# Patient Record
Sex: Male | Born: 1955 | Race: Black or African American | Hispanic: No | Marital: Single | Smoking: Never smoker
Health system: Southern US, Community
[De-identification: ages and names within clinical notes are randomized; demographics above are authoritative.]

## PROBLEM LIST (undated history)

## (undated) DIAGNOSIS — E669 Obesity, unspecified: Secondary | ICD-10-CM

## (undated) DIAGNOSIS — E119 Type 2 diabetes mellitus without complications: Secondary | ICD-10-CM

## (undated) DIAGNOSIS — E785 Hyperlipidemia, unspecified: Secondary | ICD-10-CM

## (undated) DIAGNOSIS — H269 Unspecified cataract: Secondary | ICD-10-CM

## (undated) DIAGNOSIS — I10 Essential (primary) hypertension: Secondary | ICD-10-CM

## (undated) HISTORY — DX: Essential (primary) hypertension: I10

## (undated) HISTORY — DX: Type 2 diabetes mellitus without complications: E11.9

## (undated) HISTORY — DX: Hyperlipidemia, unspecified: E78.5

## (undated) HISTORY — DX: Unspecified cataract: H26.9

## (undated) HISTORY — PX: ABDOMINAL SURGERY: SHX537

## (undated) HISTORY — DX: Obesity, unspecified: E66.9

---

## 1998-04-25 ENCOUNTER — Emergency Department (HOSPITAL_COMMUNITY): Admission: EM | Admit: 1998-04-25 | Discharge: 1998-04-25 | Payer: Self-pay | Admitting: Emergency Medicine

## 2002-07-19 ENCOUNTER — Encounter: Admission: RE | Admit: 2002-07-19 | Discharge: 2002-10-17 | Payer: Self-pay | Admitting: Internal Medicine

## 2004-04-16 ENCOUNTER — Emergency Department (HOSPITAL_COMMUNITY): Admission: EM | Admit: 2004-04-16 | Discharge: 2004-04-16 | Payer: Self-pay | Admitting: Emergency Medicine

## 2005-09-03 ENCOUNTER — Emergency Department (HOSPITAL_COMMUNITY): Admission: EM | Admit: 2005-09-03 | Discharge: 2005-09-03 | Payer: Self-pay | Admitting: Emergency Medicine

## 2007-06-26 ENCOUNTER — Emergency Department (HOSPITAL_COMMUNITY): Admission: EM | Admit: 2007-06-26 | Discharge: 2007-06-26 | Payer: Self-pay | Admitting: Emergency Medicine

## 2008-07-27 ENCOUNTER — Observation Stay (HOSPITAL_COMMUNITY): Admission: EM | Admit: 2008-07-27 | Discharge: 2008-07-27 | Payer: Self-pay | Admitting: Emergency Medicine

## 2008-08-01 ENCOUNTER — Inpatient Hospital Stay (HOSPITAL_COMMUNITY): Admission: EM | Admit: 2008-08-01 | Discharge: 2008-08-06 | Payer: Self-pay | Admitting: Emergency Medicine

## 2008-09-12 ENCOUNTER — Ambulatory Visit: Payer: Self-pay | Admitting: Internal Medicine

## 2008-09-16 ENCOUNTER — Ambulatory Visit: Payer: Self-pay | Admitting: *Deleted

## 2008-10-17 ENCOUNTER — Ambulatory Visit: Payer: Self-pay | Admitting: Internal Medicine

## 2009-01-09 ENCOUNTER — Ambulatory Visit: Payer: Self-pay | Admitting: Internal Medicine

## 2009-01-09 LAB — CONVERTED CEMR LAB
AST: 17 units/L (ref 0–37)
Albumin: 4.6 g/dL (ref 3.5–5.2)
BUN: 13 mg/dL (ref 6–23)
Calcium: 9.8 mg/dL (ref 8.4–10.5)
Chloride: 106 meq/L (ref 96–112)
Creatinine, Ser: 1.1 mg/dL (ref 0.40–1.50)
Glucose, Bld: 198 mg/dL — ABNORMAL HIGH (ref 70–99)
HDL: 45 mg/dL (ref >39)
Microalb, Ur: 0.56 mg/dL (ref 0.00–1.89)
Potassium: 4.8 meq/L (ref 3.5–5.3)
Total CHOL/HDL Ratio: 3.9
Triglycerides: 76 mg/dL (ref <150)

## 2009-01-22 ENCOUNTER — Ambulatory Visit: Payer: Self-pay | Admitting: Internal Medicine

## 2009-05-21 ENCOUNTER — Ambulatory Visit: Payer: Self-pay | Admitting: Internal Medicine

## 2009-10-22 ENCOUNTER — Observation Stay (HOSPITAL_COMMUNITY): Admission: EM | Admit: 2009-10-22 | Discharge: 2009-10-22 | Payer: Self-pay | Admitting: Emergency Medicine

## 2010-01-16 ENCOUNTER — Observation Stay (HOSPITAL_COMMUNITY)
Admission: EM | Admit: 2010-01-16 | Discharge: 2010-01-17 | Payer: Self-pay | Source: Home / Self Care | Admitting: Emergency Medicine

## 2010-02-04 ENCOUNTER — Inpatient Hospital Stay (HOSPITAL_COMMUNITY): Admission: EM | Admit: 2010-02-04 | Discharge: 2010-02-06 | Payer: Self-pay | Admitting: Emergency Medicine

## 2010-07-30 LAB — BASIC METABOLIC PANEL
BUN: 17 mg/dL (ref 6–23)
BUN: 18 mg/dL (ref 6–23)
BUN: 19 mg/dL (ref 6–23)
BUN: 19 mg/dL (ref 6–23)
BUN: 19 mg/dL (ref 6–23)
BUN: 20 mg/dL (ref 6–23)
BUN: 18 mg/dL (ref 6–23)
CO2: 21 mEq/L (ref 19–32)
CO2: 23 mEq/L (ref 19–32)
CO2: 23 mEq/L (ref 19–32)
CO2: 24 mEq/L (ref 19–32)
CO2: 22 mEq/L (ref 19–32)
Calcium: 9 mg/dL (ref 8.4–10.5)
Calcium: 9.2 mg/dL (ref 8.4–10.5)
Chloride: 112 mEq/L (ref 96–112)
Chloride: 114 mEq/L — ABNORMAL HIGH (ref 96–112)
Chloride: 116 mEq/L — ABNORMAL HIGH (ref 96–112)
Chloride: 117 mEq/L — ABNORMAL HIGH (ref 96–112)
Chloride: 117 mEq/L — ABNORMAL HIGH (ref 96–112)
Chloride: 117 mEq/L — ABNORMAL HIGH (ref 96–112)
Chloride: 104 mEq/L (ref 96–112)
Creatinine, Ser: 1.26 mg/dL (ref 0.4–1.5)
Creatinine, Ser: 1.31 mg/dL (ref 0.4–1.5)
Creatinine, Ser: 1.38 mg/dL (ref 0.4–1.5)
Creatinine, Ser: 1.39 mg/dL (ref 0.4–1.5)
GFR calc Af Amer: 53 mL/min — ABNORMAL LOW (ref >60)
GFR calc Af Amer: >60 mL/min (ref >60)
GFR calc non Af Amer: 44 mL/min — ABNORMAL LOW (ref >60)
GFR calc non Af Amer: 54 mL/min — ABNORMAL LOW (ref >60)
GFR calc non Af Amer: >60 mL/min (ref >60)
Glucose, Bld: 109 mg/dL — ABNORMAL HIGH (ref 70–99)
Glucose, Bld: 181 mg/dL — ABNORMAL HIGH (ref 70–99)
Glucose, Bld: 261 mg/dL — ABNORMAL HIGH (ref 70–99)
Glucose, Bld: 305 mg/dL — ABNORMAL HIGH (ref 70–99)
Glucose, Bld: 306 mg/dL — ABNORMAL HIGH (ref 70–99)
Glucose, Bld: 332 mg/dL — ABNORMAL HIGH (ref 70–99)
Glucose, Bld: 344 mg/dL — ABNORMAL HIGH (ref 70–99)
Glucose, Bld: 237 mg/dL — ABNORMAL HIGH (ref 70–99)
Potassium: 3.6 mEq/L (ref 3.5–5.1)
Potassium: 3.9 mEq/L (ref 3.5–5.1)
Potassium: 4 mEq/L (ref 3.5–5.1)
Potassium: 4 mEq/L (ref 3.5–5.1)
Potassium: 4.1 mEq/L (ref 3.5–5.1)
Potassium: 4.2 mEq/L (ref 3.5–5.1)
Potassium: 4.3 mEq/L (ref 3.5–5.1)
Potassium: 3.9 mEq/L (ref 3.5–5.1)
Sodium: 140 mEq/L (ref 135–145)
Sodium: 146 mEq/L — ABNORMAL HIGH (ref 135–145)
Sodium: 147 mEq/L — ABNORMAL HIGH (ref 135–145)
Sodium: 148 mEq/L — ABNORMAL HIGH (ref 135–145)
Sodium: 140 mEq/L (ref 135–145)

## 2010-07-30 LAB — COMPREHENSIVE METABOLIC PANEL
ALT: 10 U/L (ref 0–53)
AST: 9 U/L (ref 0–37)
Albumin: 3.1 g/dL — ABNORMAL LOW (ref 3.5–5.2)
Albumin: 4.6 g/dL (ref 3.5–5.2)
Albumin: 4.5 g/dL (ref 3.5–5.2)
Alkaline Phosphatase: 104 U/L (ref 39–117)
BUN: 18 mg/dL (ref 6–23)
BUN: 22 mg/dL (ref 6–23)
BUN: 20 mg/dL (ref 6–23)
CO2: 13 mEq/L — ABNORMAL LOW (ref 19–32)
Chloride: 98 mEq/L (ref 96–112)
Chloride: 100 mEq/L (ref 96–112)
Creatinine, Ser: 1.31 mg/dL (ref 0.4–1.5)
Creatinine, Ser: 2.25 mg/dL — ABNORMAL HIGH (ref 0.4–1.5)
Creatinine, Ser: 1.64 mg/dL — ABNORMAL HIGH (ref 0.4–1.5)
GFR calc non Af Amer: 31 mL/min — ABNORMAL LOW (ref >60)
GFR calc non Af Amer: 57 mL/min — ABNORMAL LOW (ref >60)
Glucose, Bld: 273 mg/dL — ABNORMAL HIGH (ref 70–99)
Glucose, Bld: 731 mg/dL (ref 70–99)
Glucose, Bld: 478 mg/dL — ABNORMAL HIGH (ref 70–99)
Potassium: 6 mEq/L — ABNORMAL HIGH (ref 3.5–5.1)
Total Bilirubin: 1.5 mg/dL — ABNORMAL HIGH (ref 0.3–1.2)
Total Bilirubin: 1.5 mg/dL — ABNORMAL HIGH (ref 0.3–1.2)
Total Protein: 5.8 g/dL — ABNORMAL LOW (ref 6.0–8.3)

## 2010-07-30 LAB — CBC
HCT: 39.3 % (ref 39.0–52.0)
HCT: 49.6 % (ref 39.0–52.0)
HCT: 43.8 % (ref 39.0–52.0)
Hemoglobin: 17.5 g/dL — ABNORMAL HIGH (ref 13.0–17.0)
MCH: 28.4 pg (ref 26.0–34.0)
MCH: 29.8 pg (ref 26.0–34.0)
MCH: 30.2 pg (ref 26.0–34.0)
MCV: 82.6 fL (ref 78.0–100.0)
MCV: 84.5 fL (ref 78.0–100.0)
MCV: 83.6 fL (ref 78.0–100.0)
Platelets: 184 10*3/uL (ref 150–400)
Platelets: 240 10*3/uL (ref 150–400)
RBC: 5.87 MIL/uL — ABNORMAL HIGH (ref 4.22–5.81)
RBC: 5.24 MIL/uL (ref 4.22–5.81)
RDW: 13.6 % (ref 11.5–15.5)
RDW: 13 % (ref 11.5–15.5)
WBC: 8.2 10*3/uL (ref 4.0–10.5)

## 2010-07-30 LAB — POCT I-STAT 3, ART BLOOD GAS (G3+)
Bicarbonate: 11.9 mEq/L — ABNORMAL LOW (ref 20.0–24.0)
TCO2: 13 mmol/L (ref 0–100)
pH, Arterial: 7.29 — ABNORMAL LOW (ref 7.350–7.450)
pO2, Arterial: 81 mmHg (ref 80.0–100.0)

## 2010-07-30 LAB — GLUCOSE, CAPILLARY
Glucose-Capillary: 129 mg/dL — ABNORMAL HIGH (ref 70–99)
Glucose-Capillary: 138 mg/dL — ABNORMAL HIGH (ref 70–99)
Glucose-Capillary: 182 mg/dL — ABNORMAL HIGH (ref 70–99)
Glucose-Capillary: 232 mg/dL — ABNORMAL HIGH (ref 70–99)
Glucose-Capillary: 310 mg/dL — ABNORMAL HIGH (ref 70–99)
Glucose-Capillary: 353 mg/dL — ABNORMAL HIGH (ref 70–99)
Glucose-Capillary: 466 mg/dL — ABNORMAL HIGH (ref 70–99)
Glucose-Capillary: 326 mg/dL — ABNORMAL HIGH (ref 70–99)
Glucose-Capillary: 326 mg/dL — ABNORMAL HIGH (ref 70–99)
Glucose-Capillary: 331 mg/dL — ABNORMAL HIGH (ref 70–99)
Glucose-Capillary: 385 mg/dL — ABNORMAL HIGH (ref 70–99)
Glucose-Capillary: 506 mg/dL — ABNORMAL HIGH (ref 70–99)

## 2010-07-30 LAB — DIFFERENTIAL
Basophils Absolute: 0 10*3/uL (ref 0.0–0.1)
Basophils Absolute: 0 10*3/uL (ref 0.0–0.1)
Basophils Relative: 0 % (ref 0–1)
Basophils Relative: 0 % (ref 0–1)
Eosinophils Absolute: 0.1 10*3/uL (ref 0.0–0.7)
Eosinophils Relative: 1 % (ref 0–5)
Lymphocytes Relative: 7 % — ABNORMAL LOW (ref 12–46)
Lymphocytes Relative: 15 % (ref 12–46)
Lymphs Abs: 1.3 10*3/uL (ref 0.7–4.0)
Monocytes Absolute: 0.3 10*3/uL (ref 0.1–1.0)
Monocytes Absolute: 0.2 10*3/uL (ref 0.1–1.0)
Neutro Abs: 7.4 10*3/uL (ref 1.7–7.7)
Neutro Abs: 6.4 10*3/uL (ref 1.7–7.7)
Neutrophils Relative %: 90 % — ABNORMAL HIGH (ref 43–77)
Neutrophils Relative %: 82 % — ABNORMAL HIGH (ref 43–77)

## 2010-07-30 LAB — URINALYSIS, ROUTINE W REFLEX MICROSCOPIC
Hgb urine dipstick: NEGATIVE
Leukocytes, UA: NEGATIVE
Nitrite: NEGATIVE
Protein, ur: NEGATIVE mg/dL
Protein, ur: NEGATIVE mg/dL
Urobilinogen, UA: 0.2 mg/dL (ref 0.0–1.0)
Urobilinogen, UA: 0.2 mg/dL (ref 0.0–1.0)

## 2010-07-30 LAB — CARDIAC PANEL(CRET KIN+CKTOT+MB+TROPI)
CK, MB: 0.5 ng/mL (ref 0.3–4.0)
CK, MB: 0.5 ng/mL (ref 0.3–4.0)
Relative Index: INVALID (ref 0.0–2.5)
Total CK: 22 U/L (ref 7–232)
Total CK: 24 U/L (ref 7–232)
Troponin I: 0.01 ng/mL (ref 0.00–0.06)
Troponin I: 0.01 ng/mL (ref 0.00–0.06)
Troponin I: <0.01 ng/mL (ref 0.00–0.06)

## 2010-07-30 LAB — LIPASE, BLOOD
Lipase: 24 U/L (ref 11–59)
Lipase: 31 U/L (ref 11–59)

## 2010-07-30 LAB — HEMOGLOBIN A1C: Hgb A1c MFr Bld: 14.6 % — ABNORMAL HIGH (ref <5.7)

## 2010-07-30 LAB — MAGNESIUM: Magnesium: 2.1 mg/dL (ref 1.5–2.5)

## 2010-07-30 LAB — URINE MICROSCOPIC-ADD ON

## 2010-08-03 LAB — POCT I-STAT, CHEM 8
Calcium, Ion: 1.15 mmol/L (ref 1.12–1.32)
Chloride: 99 mEq/L (ref 96–112)
Glucose, Bld: 569 mg/dL (ref 70–99)
HCT: 48 % (ref 39.0–52.0)
TCO2: 27 mmol/L (ref 0–100)

## 2010-08-03 LAB — URINE MICROSCOPIC-ADD ON

## 2010-08-03 LAB — GLUCOSE, CAPILLARY

## 2010-08-03 LAB — URINALYSIS, ROUTINE W REFLEX MICROSCOPIC
Bilirubin Urine: NEGATIVE
Glucose, UA: >1000 mg/dL — AB
Hgb urine dipstick: NEGATIVE
Specific Gravity, Urine: 1.02 (ref 1.005–1.030)
Urobilinogen, UA: 0.2 mg/dL (ref 0.0–1.0)
pH: 5 (ref 5.0–8.0)

## 2010-08-27 LAB — GLUCOSE, CAPILLARY
Glucose-Capillary: 100 mg/dL — ABNORMAL HIGH (ref 70–99)
Glucose-Capillary: 106 mg/dL — ABNORMAL HIGH (ref 70–99)
Glucose-Capillary: 107 mg/dL — ABNORMAL HIGH (ref 70–99)
Glucose-Capillary: 108 mg/dL — ABNORMAL HIGH (ref 70–99)
Glucose-Capillary: 109 mg/dL — ABNORMAL HIGH (ref 70–99)
Glucose-Capillary: 119 mg/dL — ABNORMAL HIGH (ref 70–99)
Glucose-Capillary: 123 mg/dL — ABNORMAL HIGH (ref 70–99)
Glucose-Capillary: 149 mg/dL — ABNORMAL HIGH (ref 70–99)
Glucose-Capillary: 159 mg/dL — ABNORMAL HIGH (ref 70–99)
Glucose-Capillary: 169 mg/dL — ABNORMAL HIGH (ref 70–99)
Glucose-Capillary: 176 mg/dL — ABNORMAL HIGH (ref 70–99)
Glucose-Capillary: 192 mg/dL — ABNORMAL HIGH (ref 70–99)
Glucose-Capillary: 199 mg/dL — ABNORMAL HIGH (ref 70–99)
Glucose-Capillary: 241 mg/dL — ABNORMAL HIGH (ref 70–99)
Glucose-Capillary: 257 mg/dL — ABNORMAL HIGH (ref 70–99)
Glucose-Capillary: 263 mg/dL — ABNORMAL HIGH (ref 70–99)
Glucose-Capillary: 89 mg/dL (ref 70–99)
Glucose-Capillary: 96 mg/dL (ref 70–99)
Glucose-Capillary: 97 mg/dL (ref 70–99)
Glucose-Capillary: 98 mg/dL (ref 70–99)
Glucose-Capillary: 98 mg/dL (ref 70–99)
Glucose-Capillary: 233 mg/dL — ABNORMAL HIGH (ref 70–99)
Glucose-Capillary: 235 mg/dL — ABNORMAL HIGH (ref 70–99)
Glucose-Capillary: 309 mg/dL — ABNORMAL HIGH (ref 70–99)

## 2010-08-27 LAB — DIFFERENTIAL
Basophils Relative: 1 % (ref 0–1)
Eosinophils Absolute: 0 10*3/uL (ref 0.0–0.7)
Eosinophils Absolute: 0 10*3/uL (ref 0.0–0.7)
Eosinophils Relative: 0 % (ref 0–5)
Eosinophils Relative: 0 % (ref 0–5)
Eosinophils Relative: 0 % (ref 0–5)
Lymphocytes Relative: 14 % (ref 12–46)
Lymphs Abs: 1 10*3/uL (ref 0.7–4.0)
Lymphs Abs: 0.7 10*3/uL (ref 0.7–4.0)
Monocytes Absolute: 0.3 10*3/uL (ref 0.1–1.0)
Monocytes Absolute: 0.3 10*3/uL (ref 0.1–1.0)
Monocytes Absolute: 0.2 10*3/uL (ref 0.1–1.0)
Monocytes Relative: 4 % (ref 3–12)
Monocytes Relative: 5 % (ref 3–12)
Monocytes Relative: 2 % — ABNORMAL LOW (ref 3–12)

## 2010-08-27 LAB — COMPREHENSIVE METABOLIC PANEL
ALT: <8 U/L (ref 0–53)
ALT: 14 U/L (ref 0–53)
AST: 17 U/L (ref 0–37)
Albumin: 3.9 g/dL (ref 3.5–5.2)
Alkaline Phosphatase: 104 U/L (ref 39–117)
Calcium: 9.1 mg/dL (ref 8.4–10.5)
Chloride: 96 mEq/L (ref 96–112)
GFR calc Af Amer: 48 mL/min — ABNORMAL LOW (ref >60)
Glucose, Bld: 616 mg/dL (ref 70–99)
Potassium: 4.9 mEq/L (ref 3.5–5.1)
Potassium: 5.4 mEq/L — ABNORMAL HIGH (ref 3.5–5.1)
Sodium: 128 mEq/L — ABNORMAL LOW (ref 135–145)
Sodium: 131 mEq/L — ABNORMAL LOW (ref 135–145)
Total Protein: 7.3 g/dL (ref 6.0–8.3)
Total Protein: 8.2 g/dL (ref 6.0–8.3)

## 2010-08-27 LAB — POCT I-STAT, CHEM 8
BUN: 24 mg/dL — ABNORMAL HIGH (ref 6–23)
Calcium, Ion: 0.91 mmol/L — ABNORMAL LOW (ref 1.12–1.32)
Creatinine, Ser: 1.3 mg/dL (ref 0.4–1.5)
Glucose, Bld: 629 mg/dL (ref 70–99)
TCO2: 10 mmol/L (ref 0–100)

## 2010-08-27 LAB — CBC
HCT: 40.3 % (ref 39.0–52.0)
HCT: 41.6 % (ref 39.0–52.0)
Hemoglobin: 13.9 g/dL (ref 13.0–17.0)
Hemoglobin: 14.4 g/dL (ref 13.0–17.0)
Hemoglobin: 16.4 g/dL (ref 13.0–17.0)
MCHC: 33.8 g/dL (ref 30.0–36.0)
MCV: 86.7 fL (ref 78.0–100.0)
Platelets: 142 10*3/uL — ABNORMAL LOW (ref 150–400)
Platelets: 242 10*3/uL (ref 150–400)
RBC: 4.34 MIL/uL (ref 4.22–5.81)
RBC: 4.7 MIL/uL (ref 4.22–5.81)
RBC: 4.85 MIL/uL (ref 4.22–5.81)
RBC: 5.69 MIL/uL (ref 4.22–5.81)
RDW: 13.8 % (ref 11.5–15.5)
RDW: 13.8 % (ref 11.5–15.5)
WBC: 5.7 10*3/uL (ref 4.0–10.5)
WBC: 6.8 10*3/uL (ref 4.0–10.5)
WBC: 7.9 10*3/uL (ref 4.0–10.5)
WBC: 7.9 10*3/uL (ref 4.0–10.5)

## 2010-08-27 LAB — BASIC METABOLIC PANEL
BUN: 3 mg/dL — ABNORMAL LOW (ref 6–23)
BUN: 3 mg/dL — ABNORMAL LOW (ref 6–23)
BUN: 4 mg/dL — ABNORMAL LOW (ref 6–23)
BUN: 4 mg/dL — ABNORMAL LOW (ref 6–23)
Calcium: 5.9 mg/dL — CL (ref 8.4–10.5)
Calcium: 8 mg/dL — ABNORMAL LOW (ref 8.4–10.5)
Chloride: 108 mEq/L (ref 96–112)
Chloride: 109 mEq/L (ref 96–112)
Creatinine, Ser: 1.05 mg/dL (ref 0.4–1.5)
GFR calc Af Amer: >60 mL/min (ref >60)
GFR calc Af Amer: >60 mL/min (ref >60)
GFR calc Af Amer: >60 mL/min (ref >60)
GFR calc Af Amer: >60 mL/min (ref >60)
GFR calc non Af Amer: 55 mL/min — ABNORMAL LOW (ref >60)
GFR calc non Af Amer: >60 mL/min (ref >60)
GFR calc non Af Amer: >60 mL/min (ref >60)
GFR calc non Af Amer: >60 mL/min (ref >60)
GFR calc non Af Amer: >60 mL/min (ref >60)
GFR calc non Af Amer: >60 mL/min (ref >60)
Glucose, Bld: 111 mg/dL — ABNORMAL HIGH (ref 70–99)
Glucose, Bld: 189 mg/dL — ABNORMAL HIGH (ref 70–99)
Glucose, Bld: 226 mg/dL — ABNORMAL HIGH (ref 70–99)
Potassium: 2.5 mEq/L — CL (ref 3.5–5.1)
Potassium: 3.1 mEq/L — ABNORMAL LOW (ref 3.5–5.1)
Potassium: 3.2 mEq/L — ABNORMAL LOW (ref 3.5–5.1)
Potassium: 4.2 mEq/L (ref 3.5–5.1)
Sodium: 137 mEq/L (ref 135–145)
Sodium: 140 mEq/L (ref 135–145)
Sodium: 140 mEq/L (ref 135–145)
Sodium: 140 mEq/L (ref 135–145)
Sodium: 146 mEq/L — ABNORMAL HIGH (ref 135–145)

## 2010-08-27 LAB — URINALYSIS, ROUTINE W REFLEX MICROSCOPIC
Bilirubin Urine: NEGATIVE
Glucose, UA: >1000 mg/dL — AB
Hgb urine dipstick: NEGATIVE
Ketones, ur: >80 mg/dL — AB
Ketones, ur: >80 mg/dL — AB
Leukocytes, UA: NEGATIVE
Nitrite: NEGATIVE
Protein, ur: NEGATIVE mg/dL
pH: 5.5 (ref 5.0–8.0)

## 2010-08-27 LAB — POCT I-STAT 3, VENOUS BLOOD GAS (G3P V)
O2 Saturation: 82 %
O2 Saturation: 47 %
Patient temperature: 96.8
TCO2: 12 mmol/L (ref 0–100)
pCO2, Ven: 22.3 mmHg — ABNORMAL LOW (ref 45.0–50.0)
pCO2, Ven: 34.3 mmHg — ABNORMAL LOW (ref 45.0–50.0)
pO2, Ven: 30 mmHg (ref 30.0–45.0)

## 2010-08-27 LAB — URINE CULTURE

## 2010-08-27 LAB — URINE MICROSCOPIC-ADD ON

## 2010-09-29 NOTE — H&P (Signed)
Nicholas Schroeder, Nicholas Schroeder              ACCOUNT NO.:  [id number]   MEDICAL RECORD NO.:  [id number]          PATIENT TYPE:  INP   LOCATION:  3312                         FACILITY:  [facility]   PHYSICIAN:  Cherylynn Ridges, M.D.    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   I was asked to see this patient by the PA in the emergency department,  who said the patient came in with a glucose of over 600, feeling weak,  tired, had had a previous ER visit with abdominal pain, nausea,  vomiting, but no CT scan was done.  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  Surgical consultation was obtained.   PAST MEDICAL HISTORY:  Remarkable for non-insulin-dependent diabetes,  which is poorly controlled.  The patient had limited funds and does not  get his medications on a regular basis, does not have a primary doctor.  He has had no prior abdominal or other surgery.  He has a very poor  eyesight longstanding partly due to his diabetes and also just  longstanding myopia.  He last had a bowel movement 4 days prior to  admission.  He has not passed any gas.  He has had nausea, vomiting, and  abdominal pain with diffuse mostly in the lower abdomen.  He has had no  fevers or chills.  No syncopal episodes.   FAMILY HISTORY:  Noncontributory.   REVIEW OF SYSTEMS:  Outside of what has been mentioned, nothing  remarkable.   PHYSICAL EXAMINATION:  VITAL SIGNS:  He is afebrile, blood pressure  91/60, it has been over 100 systolic.  HEENT:  He is normocephalic and atraumatic and anicteric.  NECK:  Supple.  No bruits.  LUNGS:  Clear to auscultation.  CARDIAC:  Regular  rhythm and rate with no murmurs, gallops, rubs, or  heaves.  ABDOMEN:  Distended, but not tense.  There is no rebound or guarding.  No diffuse peritonitis.  He has got some active bowel sounds.  There is  no palpable masses.  There is fullness, but no masses.  RECTAL:  Unremarkable.  NEUROLOGIC:  Cranial nerves II through XII are grossly intact.  PSYCHIATRIC:  The patient seems to be oriented x3 with normal mood.  EXTREMITIES:  He has got no cyanosis, clubbing, or edema.   LABORATORY STUDIES:  A normal white blood cell count.  His glucose was  over 600.  He is currently on an insulin drip.  I reviewed his CT scan,  which demonstrated a small bowel obstruction with possible contrast  obstruction and a possible tumor in the mid jejunal area.   The patient will require an operation to remove his tumor that is  blocking his bowels, however, with his glucose out of control  right now,  the preference would be to place an NG tube, decompress, and get his  sugars under control,  and then operate on within the next 24 hours.  He will be given IV  antibiotics on-call to OR, but not anything routine right now.  PAS hose  for DVT prophylaxis will be also added.  We will continue to hydrate the  patient and get him ready for surgery, try and get his sugars under  control.       Cherylynn Ridges, M.D.  Electronically Signed     JOW/MEDQ  D:  08/01/2008  T:  08/01/2008  Job:  [id number]

## 2010-09-29 NOTE — Op Note (Signed)
NAMEALIZE, ACY              ACCOUNT NO.:  [id number]   MEDICAL RECORD NO.:  [id number]          PATIENT TYPE:  INP   LOCATION:  3312                         FACILITY:  [facility]   PHYSICIAN:  Currie Paris, M.D.DATE OF BIRTH:  [birth year]   DATE OF PROCEDURE:  08/02/2008  DATE OF DISCHARGE:                               OPERATIVE REPORT   PREOPERATIVE DIAGNOSIS:  Small bowel obstruction, apparently tumor, mid  jejunum.   POSTOPERATIVE DIAGNOSIS:  Spontaneous resolved small bowel obstruction  with no tumor found.   SURGEON:  Currie Paris, M.D.   ASSISTANT:  Kelle Darting. Rennis Harding, N.P.   CLINICAL HISTORY:  This is a 55 year old poorly controlled non-insulin-  dependent diabetic who comes in with a bowel obstruction, which looked  like by CT scan a small bowel tumor.  After lengthy discussion with the  patient, it was elected take him to surgery for an exploratory  laparotomy.  We had his glucose well controlled and repleted his  potassium.   DESCRIPTION OF PROCEDURE:  The patient was seen in the holding area and  he had no further questions.  He was taken to the operating room and  after satisfactory general endotracheal anesthesia had been obtained,  Foley catheter was placed in the abdomen, clipped, prepped, and draped.  The time-out was done.   I made a midline incision from above the umbilicus to just below it and  entered the peritoneal cavity in the midline.  There was no free fluid  present.  I initially encountered some nondistended small bowel.  I  mobilized small bowel out into the wound and found no dilated small  bowel and no evidence of an active small bowel obstruction.  I ran the  small bowel from ligament of Treitz to the terminal ileum twice.  There  was no evidence of a small bowel tumor, kinks, etc.  He has not had any  prior surgery.  There were no significant adhesions.  Dr. Lindie Spruce also  came and reviewed the situation with me since he had admitted  the  patient, and we reviewed the CT scan and thought  perhaps the patient  had a small intussusception masquerading as a small bowel tumor, which  had spontaneously resolved.  I did not see, however, definite evidence  of any area that had been intercepted with any edema or other  abnormalities noted.   After a careful check of the rest of the abdomen by palpation and  finding no other abnormalities, although noting a colon full of stool,  we elected to close.   The abdomen was closed with running #1 looped PDS on the fascia.  The  wound was irrigated and skin closed with staples.   The patient tolerated the procedure well and there were no operative  complications.  All counts were correct.      Currie Paris, M.D.  Electronically Signed     CJS/MEDQ  D:  08/02/2008  T:  08/03/2008  Job:  [id number]

## 2010-09-29 NOTE — Discharge Summary (Signed)
[name], [name]              ACCOUNT NO.:  [id number]   MEDICAL RECORD NO.:  [id number]          PATIENT TYPE:  INP   LOCATION:  5123                         FACILITY:  [facility]   PHYSICIAN:  Cherylynn Ridges, M.D.    DATE OF BIRTH:  05-Apr-1956   DATE OF ADMISSION:  07/31/2008  DATE OF DISCHARGE:                               DISCHARGE SUMMARY   ADMITTING PHYSICIAN:  Marta Lamas. Lindie Spruce, MD.   PROCEDURES:  Exploratory laparotomy, which was negative for small bowel  obstruction and small bowel tumor by Dr. Cyndia Bent on August 02, 2008.   CONSULTANTS:  There were none.   REASON FOR ADMISSION:  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  In the emergency department, the  patient was found to have a glucose of over 600, feeling weak, tired,  and had had a previous ER visit with abdominal pain, nausea, and  vomiting.  At this time, a CT scan was done which showed a small  obstruction with likely a midgut small bowel tumor with distal  decompression.  At this time on exam, his abdomen was distended, but not  tense.  There was no rebound or guarding and no diffuse peritonitis.  He  does have some active bowel sounds.  At this time, the patient was  admitted.  An NG tube was placed.  He was also placed on an insulin drip  to help control his glucose and the plan was for surgical intervention  to remove the small bowel tumor.   ADMITTING DIAGNOSES:  1. 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  2. Poorly controlled diabetes mellitus.   HOSPITAL COURSE:  At this time, the patient was admitted.  An NG tube  was placed for decompression.  At this time, he was also placed on an  insulin drip to help with his glucose.  By 0930 on the first day of  admission, the patient's glucose had already decreased from over 600 to  199.  At this time, the patient was continued  with his NG tube and  obviously n.p.o. and preparation for surgical intervention the following  day.  The following the day, the patient was taken to the operating room  where an exploratory laparotomy was performed.  At this time, the  surgery was negative as the patient's small bowel obstruction had  apparently already resolved and there was no evidence of small bowel  tumor.  The patient tolerated this procedure well with no complications.  The patient's postoperative course was fairly normal.  He had an NG tube  for the first postoperative day.  By postoperative day 2, he was feeling  improved and his abdomen was soft, nondistended, and nontender with  minimal bowel sounds.  Therefore at this time, his NG tube was  discontinued; however, he was kept n.p.o. except for ice chip.  On  postoperative day 3, the patient had no nausea and with passing flatus  and was hungry.  Therefore at this time, his diet was  advanced to clear  liquids and then advanced as tolerated.  By postoperative 4, the patient  was tolerating a regular diet as well as p.o. pain medicine and not  having any complaints.  On exam, his abdomen was soft, nontender, and  nondistended with active bowel sounds.  His staples were intact with a  clean, dry wound.  Also at this time, case management has seen the  patient and set him up to be seen with [facility] for diabetic  management.   DISCHARGE DIAGNOSES:  1. Small bowel obstruction secondary to small bowel mass.  2. Status post exploratory laparotomy, which was negative for small      bowel obstruction and small bowel mass.  3. Diabetes mellitus.   DISCHARGE MEDICATIONS:  The patient cannot remember all of his home  medications.  He states that he takes 3 specific medications for his  diabetes.  Two of them are written down  1. Metformin.  2. Glucophage.  The next one he cannot remember, but we are in the      process of having him contact his girlfriend to get  name and      dosage.  He is also given a prescription for Percocet 5/325 one-two      p.o. q.4 h. p.r.n. pain as well as the prescription for Glucometer      strip, so he can test his glucose at home.   DISCHARGE INSTRUCTION:  The patient is currently on disability and does  not work.  He is informed that he may increase his activity slowly and  may walk up steps.  He may shower, however, he is not to bathe for the  next 2 weeks.  He is not to lift anything greater than approximately 15  pounds for 6 weeks.  He is not to drive for the next 1-2 weeks.  He has  no dietary restrictions.  He is to call our office for fever greater  than 101.5, or worsening abdominal pain.  He is informed to resume all  of his home medications.  He does state that his glucose was out of  control when he came in because he ran out of his prescriptions and was  unable to get them until the day before he presented here and had not  started taking them yet.  Therefore, he is informed that since he has  his normal prescriptive doses and otherwise it is normally well  controlled, he is to resume taking his 3 diabetic medications and is  then to follow up with [facility] who has agreed to help with his  diabetic management on April 29, at 8:45 a.m.  Otherwise, he is to  follow up at our [facility] next Tuesday at 3:45 for staple removal.  He  is then to follow up with Dr. Jamey Ripa in 2 weeks after that appointment.      Letha Cape, PA      Cherylynn Ridges, M.D.  Electronically Signed    KEO/MEDQ  D:  08/06/2008  T:  08/06/2008  Job:  [id number]   cc:   Currie Paris, M.D.  [facility]

## 2011-03-12 ENCOUNTER — Emergency Department (HOSPITAL_COMMUNITY): Payer: Medicare Other

## 2011-03-12 ENCOUNTER — Emergency Department (HOSPITAL_COMMUNITY)
Admission: EM | Admit: 2011-03-12 | Discharge: 2011-03-13 | Disposition: A | Discharge status: Home / Self Care | Payer: Medicare Other | Attending: Emergency Medicine | Admitting: Emergency Medicine

## 2011-03-12 DIAGNOSIS — R1032 Left lower quadrant pain: Secondary | ICD-10-CM | POA: Insufficient documentation

## 2011-03-12 DIAGNOSIS — E119 Type 2 diabetes mellitus without complications: Secondary | ICD-10-CM | POA: Insufficient documentation

## 2011-03-12 DIAGNOSIS — R109 Unspecified abdominal pain: Secondary | ICD-10-CM | POA: Insufficient documentation

## 2011-03-12 DIAGNOSIS — Z794 Long term (current) use of insulin: Secondary | ICD-10-CM | POA: Insufficient documentation

## 2011-03-12 DIAGNOSIS — N200 Calculus of kidney: Secondary | ICD-10-CM | POA: Insufficient documentation

## 2011-03-12 DIAGNOSIS — M545 Low back pain, unspecified: Secondary | ICD-10-CM | POA: Insufficient documentation

## 2011-03-12 LAB — POCT I-STAT, CHEM 8
BUN: 12 mg/dL (ref 6–23)
Calcium, Ion: 1.19 mmol/L (ref 1.12–1.32)
Chloride: 101 mEq/L (ref 96–112)
Glucose, Bld: 365 mg/dL — ABNORMAL HIGH (ref 70–99)
HCT: 42 % (ref 39.0–52.0)

## 2011-03-13 LAB — URINE MICROSCOPIC-ADD ON

## 2011-03-13 LAB — URINALYSIS, ROUTINE W REFLEX MICROSCOPIC
Glucose, UA: >1000 mg/dL — AB
Leukocytes, UA: NEGATIVE
Specific Gravity, Urine: 1.04 — ABNORMAL HIGH (ref 1.005–1.030)
pH: 5.5 (ref 5.0–8.0)

## 2011-03-14 LAB — URINE CULTURE

## 2013-01-29 ENCOUNTER — Telehealth: Payer: Self-pay

## 2013-03-30 ENCOUNTER — Encounter: Payer: Self-pay | Admitting: Cardiology

## 2013-03-30 ENCOUNTER — Ambulatory Visit: Payer: Medicare Other | Admitting: Cardiology

## 2013-03-30 DIAGNOSIS — E119 Type 2 diabetes mellitus without complications: Secondary | ICD-10-CM | POA: Insufficient documentation

## 2013-03-30 DIAGNOSIS — E669 Obesity, unspecified: Secondary | ICD-10-CM | POA: Insufficient documentation

## 2013-03-30 DIAGNOSIS — E785 Hyperlipidemia, unspecified: Secondary | ICD-10-CM | POA: Insufficient documentation

## 2013-03-30 DIAGNOSIS — I10 Essential (primary) hypertension: Secondary | ICD-10-CM | POA: Insufficient documentation

## 2013-04-03 ENCOUNTER — Encounter: Payer: Self-pay | Admitting: Cardiology

## 2013-11-11 ENCOUNTER — Emergency Department (HOSPITAL_COMMUNITY): Payer: Medicare PPO

## 2013-11-11 ENCOUNTER — Encounter (HOSPITAL_COMMUNITY): Payer: Self-pay | Admitting: Emergency Medicine

## 2013-11-11 ENCOUNTER — Emergency Department (HOSPITAL_COMMUNITY)
Admission: EM | Admit: 2013-11-11 | Discharge: 2013-11-11 | Disposition: A | Discharge status: Home / Self Care | Payer: Medicare PPO | Attending: Emergency Medicine | Admitting: Emergency Medicine

## 2013-11-11 DIAGNOSIS — S99919A Unspecified injury of unspecified ankle, initial encounter: Secondary | ICD-10-CM | POA: Diagnosis present

## 2013-11-11 DIAGNOSIS — E669 Obesity, unspecified: Secondary | ICD-10-CM | POA: Insufficient documentation

## 2013-11-11 DIAGNOSIS — Z79899 Other long term (current) drug therapy: Secondary | ICD-10-CM | POA: Insufficient documentation

## 2013-11-11 DIAGNOSIS — Y9389 Activity, other specified: Secondary | ICD-10-CM | POA: Insufficient documentation

## 2013-11-11 DIAGNOSIS — Y9289 Other specified places as the place of occurrence of the external cause: Secondary | ICD-10-CM | POA: Insufficient documentation

## 2013-11-11 DIAGNOSIS — Z7982 Long term (current) use of aspirin: Secondary | ICD-10-CM | POA: Diagnosis not present

## 2013-11-11 DIAGNOSIS — S8990XA Unspecified injury of unspecified lower leg, initial encounter: Secondary | ICD-10-CM | POA: Diagnosis present

## 2013-11-11 DIAGNOSIS — E119 Type 2 diabetes mellitus without complications: Secondary | ICD-10-CM | POA: Insufficient documentation

## 2013-11-11 DIAGNOSIS — I10 Essential (primary) hypertension: Secondary | ICD-10-CM | POA: Diagnosis not present

## 2013-11-11 DIAGNOSIS — S90129A Contusion of unspecified lesser toe(s) without damage to nail, initial encounter: Secondary | ICD-10-CM | POA: Diagnosis not present

## 2013-11-11 DIAGNOSIS — M25561 Pain in right knee: Secondary | ICD-10-CM

## 2013-11-11 DIAGNOSIS — IMO0002 Reserved for concepts with insufficient information to code with codable children: Secondary | ICD-10-CM | POA: Insufficient documentation

## 2013-11-11 DIAGNOSIS — M25562 Pain in left knee: Secondary | ICD-10-CM

## 2013-11-11 DIAGNOSIS — Z794 Long term (current) use of insulin: Secondary | ICD-10-CM | POA: Insufficient documentation

## 2013-11-11 MED ORDER — TRAMADOL HCL 50 MG PO TABS: 50.0000 mg | ORAL_TABLET | Freq: Four times a day (QID) | ORAL | Status: DC | PRN
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
## 2013-11-11 NOTE — Discharge Instructions (Signed)
Please read and follow all provided instructions.  Your diagnoses today include:  1. Arthralgia of both knees     Tests performed today include:  An x-ray of the affected areas - does NOT show any broken bones  Vital signs. See below for your results today.   Medications prescribed:   Tramadol - narcotic-like pain medication  DO NOT drive or perform any activities that require you to be awake and alert because this medicine can make you drowsy.   Take any prescribed medications only as directed.  Home care instructions:   Follow any educational materials contained in this packet  Follow R.I.C.E. Protocol:  R - rest your injury   I  - use ice on injury without applying directly to skin  C - compress injury with bandage or splint  E - elevate the injury as much as possible  Follow-up instructions: Please follow-up with your primary care provider or the provided orthopedic physician (bone specialist) if you continue to have significant pain or trouble walking in 1 week. In this case you may have a severe injury that requires further care.   Return instructions:   Please return if your toes are numb or tingling, appear gray or blue, or you have severe pain (also elevate leg and loosen splint or wrap if you were given one)  Please return to the Emergency Department if you experience worsening symptoms.   Please return if you have any other emergent concerns.  Additional Information:  Your vital signs today were: BP 147/85   Pulse 82   Temp(Src) 97.9 F (36.6 C) (Oral)   Resp 20   Ht 5\' 10"  (1.778 m)   Wt 260 lb (117.935 kg)   BMI 37.31 kg/m2   SpO2 92% If your blood pressure (BP) was elevated above 135/85 this visit, please have this repeated by your doctor within one month. --------------

## 2013-11-12 NOTE — ED Provider Notes (Signed)
Medical screening examination/treatment/procedure(s) were performed by non-physician practitioner and as supervising physician I was immediately available for consultation/collaboration.   EKG Interpretation None        Merryl Hacker, MD 11/12/13 [phone]

## 2014-11-10 IMAGING — CR DG KNEE COMPLETE 4+V*L*
4 series · 4 of 4 positions shown · non-contrast
Comparison: None.

CLINICAL DATA: Medial leg pain.

EXAM:
LEFT KNEE - COMPLETE 4+ VIEW

[x knee ap left]
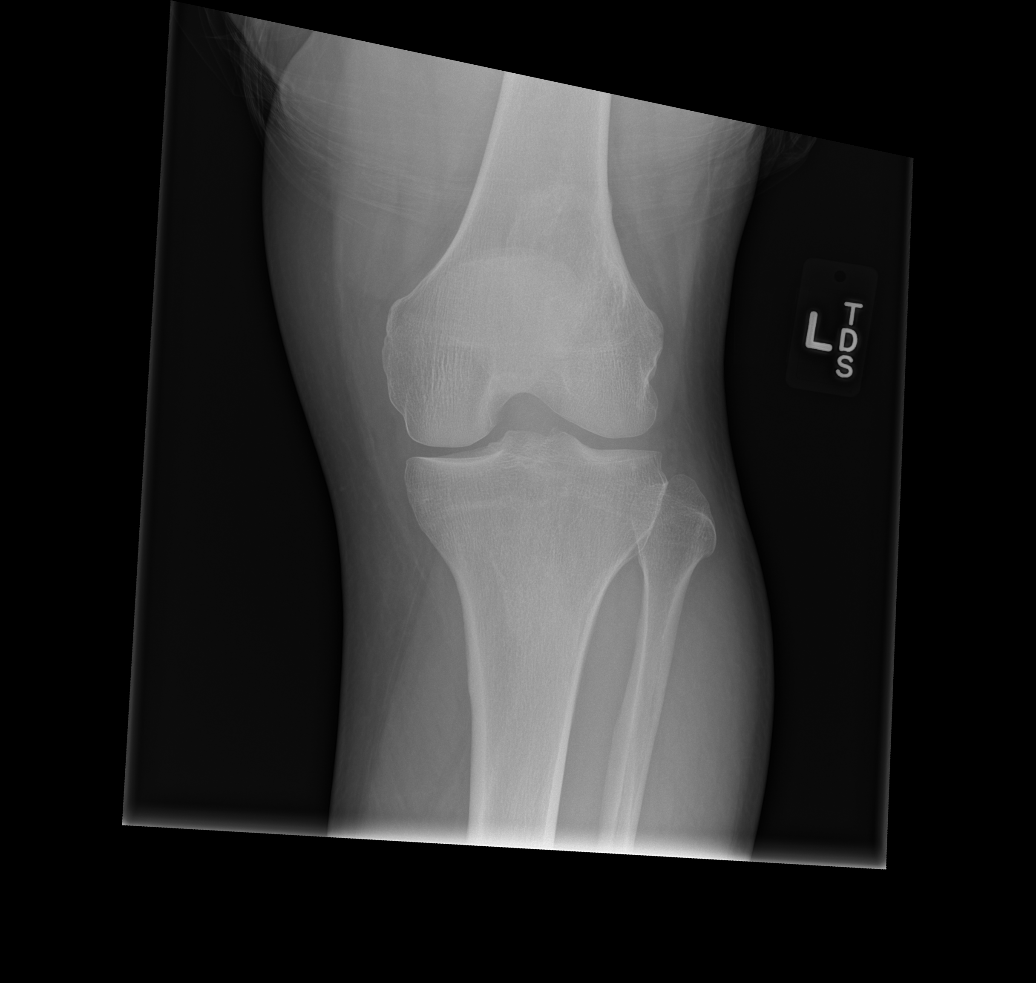

[x knee obl left (1 of 2)]
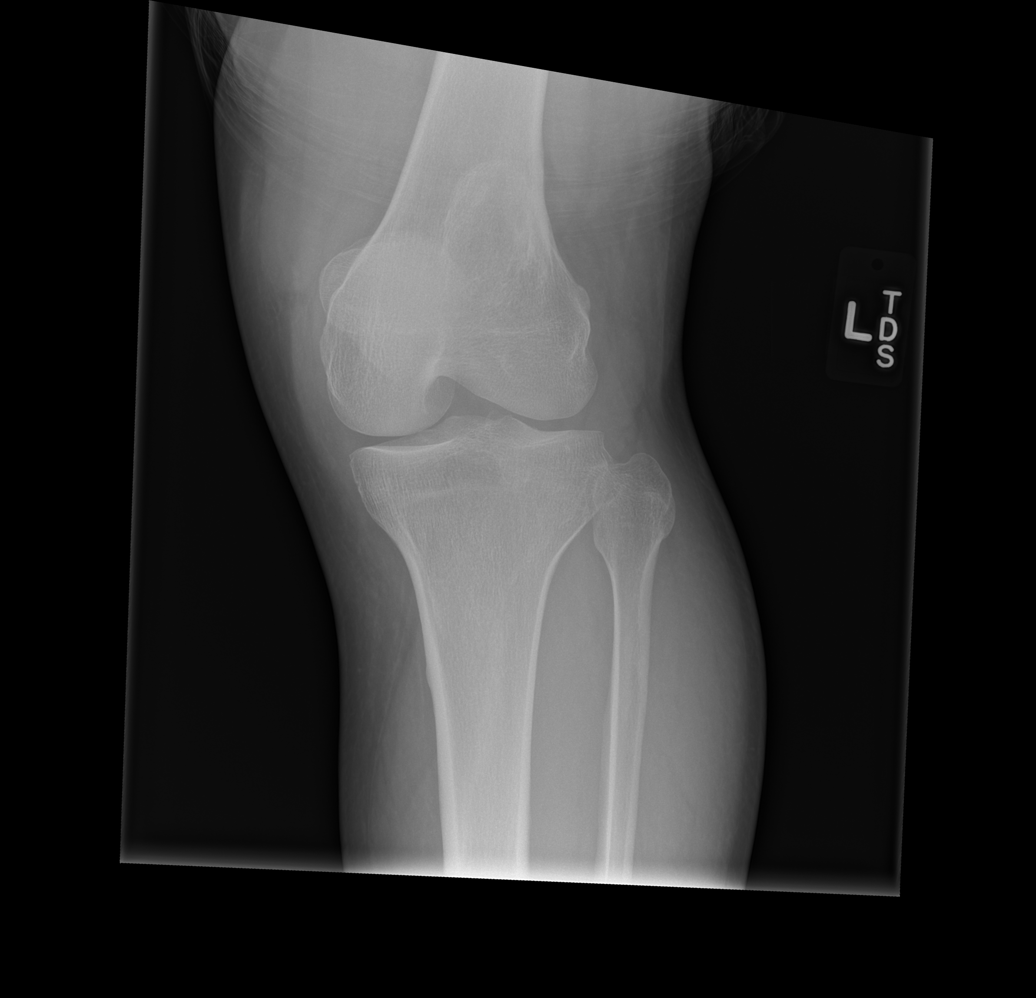

[x knee obl left (2 of 2)]
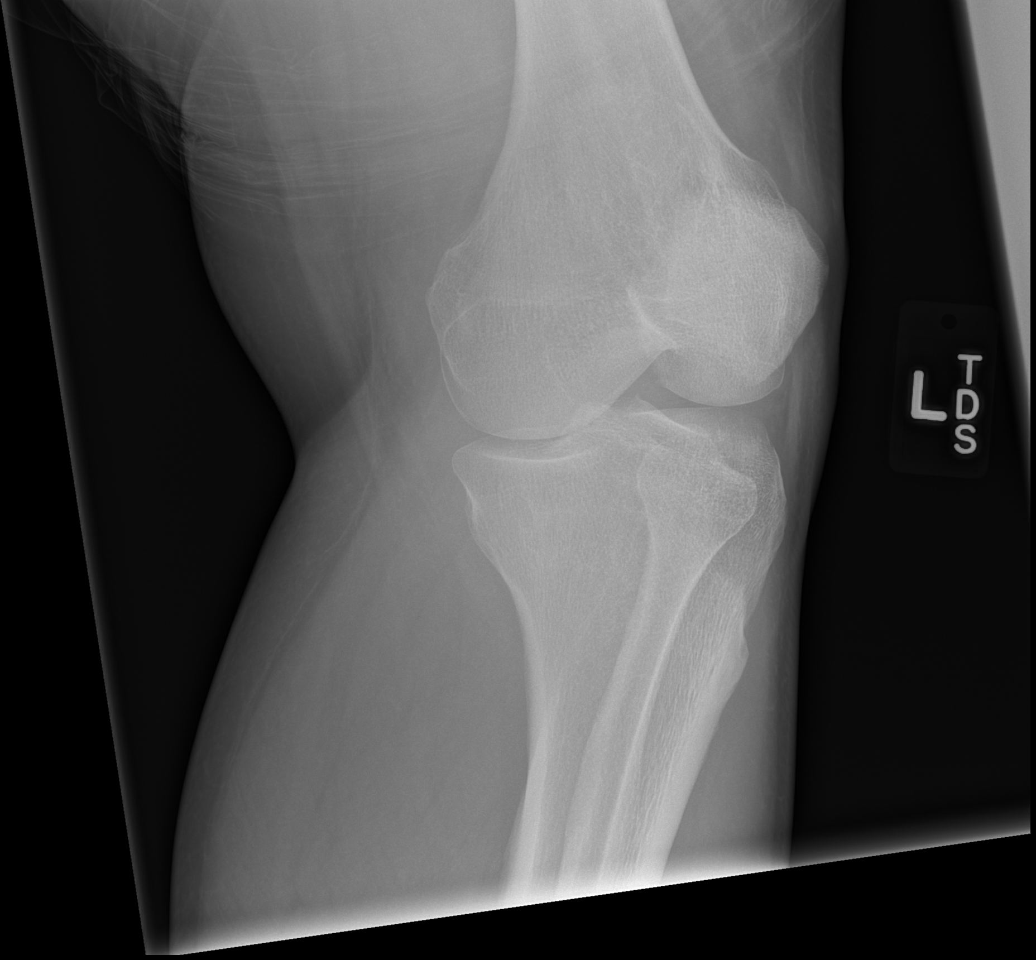

[x knee lat left]
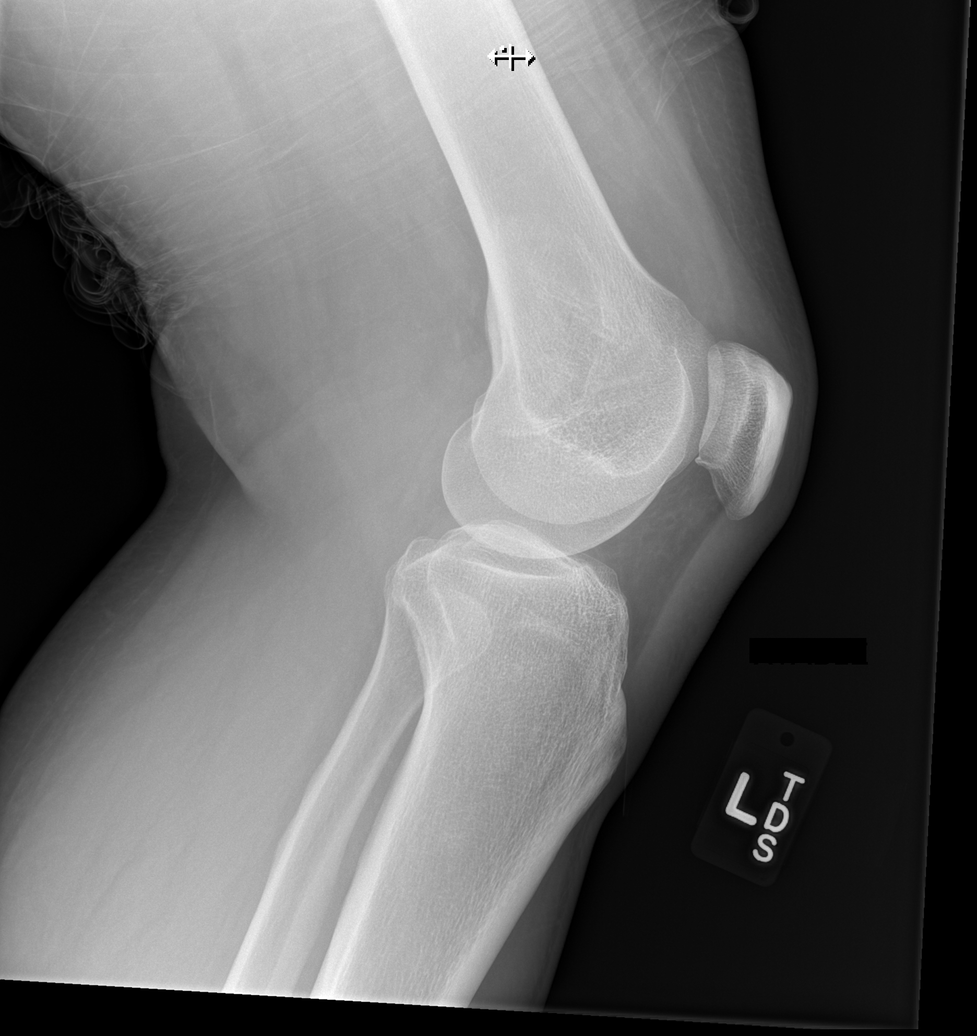

[4 of 4 positions shown; findings below may reference images not displayed]

FINDINGS: Anatomic alignment. There is no fracture identified. Mild
patellofemoral osteoarthritis. No effusion.
IMPRESSION: No acute osseous injury.

## 2016-06-23 ENCOUNTER — Ambulatory Visit (INDEPENDENT_AMBULATORY_CARE_PROVIDER_SITE_OTHER): Payer: Medicare HMO | Admitting: Endocrinology

## 2016-06-23 ENCOUNTER — Encounter: Payer: Self-pay | Admitting: Endocrinology

## 2016-06-23 VITALS — BP 126/86 | HR 94 | Ht 69.25 in | Wt 273.0 lb

## 2016-06-23 DIAGNOSIS — E1165 Type 2 diabetes mellitus with hyperglycemia: Secondary | ICD-10-CM

## 2016-06-23 DIAGNOSIS — Z794 Long term (current) use of insulin: Secondary | ICD-10-CM | POA: Diagnosis not present

## 2016-06-23 DIAGNOSIS — E049 Nontoxic goiter, unspecified: Secondary | ICD-10-CM | POA: Diagnosis not present

## 2016-06-23 DIAGNOSIS — I10 Essential (primary) hypertension: Secondary | ICD-10-CM

## 2016-06-23 LAB — POCT GLYCOSYLATED HEMOGLOBIN (HGB A1C): Hemoglobin A1C: 6.8

## 2016-06-23 MED ORDER — CANAGLIFLOZIN 100 MG PO TABS: 100.0000 mg | ORAL_TABLET | Freq: Every day | ORAL | 0 refills | Status: DC

## 2016-06-23 MED ORDER — FREESTYLE LIBRE READER DEVI
1.0000 | 0 refills | Status: DC
Start: 2016-06-23 — End: 2016-07-21

## 2016-06-23 MED ORDER — FREESTYLE LIBRE SENSOR SYSTEM MISC: 1.0000 | 1 refills | Status: DC

## 2016-06-23 MED ORDER — CANAGLIFLOZIN 100 MG PO TABS: 100.0000 mg | ORAL_TABLET | Freq: Every day | ORAL | 1 refills | Status: DC

## 2016-06-23 NOTE — Patient Instructions (Addendum)
Check blood sugars on waking up 3-4x per week    Also check blood sugars about 2-3 hours after a meal and do this after different meals by rotation  Recommended blood sugar levels on waking up is 90-130 and about 2 hours after meal is 130-160  Please bring your blood sugar monitor to each visit, thank you  Reduce LANTUS TO 40 UNITS twice daily  No reg sodas  Invokana in am, new pill

## 2016-06-23 NOTE — Progress Notes (Signed)
Patient ID: Nicholas Schroeder, male   DOB: Jul 17, 1955, 61 y.o.   MRN: [id number]            Reason for Appointment: Consultation for Type 2 Diabetes  Referring physician: Suzanna Obey   History of Present Illness:          Date of diagnosis of type 2 diabetes mellitus: ? 2008        Background history:   Patient is unclear about when he was diagnosed to have diabetes and how his diagnosis was made Has been on metformin since diagnosis but not clear if he took other diabetes medications He was however started on insulin when his blood sugars are markedly increased in 2011 He thinks he has been on Lantus insulin all along with minimal change in doses  Recent history:   INSULIN regimen is: Lantus 48 bid with syringe       Non-insulin hypoglycemic drugs the patient is taking are: Metformin ER 2000 mg daily  Current management, blood sugar patterns and problems identified:  He has been referred by his PCP for management of his diabetes because of tendency to hypoglycemia which is no distention 11/17 and patient was mentioning episodes of hypoglycemia at night.  Also on his lab results glucose was 58 done around midday   The patient however is very unclear about how often and when he is getting low blood sugars, he thinks he gets them randomly during the day  He is not checking his blood sugars on a consistent basis except sometimes in the mornings and does appear to be mostly near normal.  He does not check readings after meals especially evening meal  Although he has chronically decreased vision he thinks he can see the markings on his syringe well  He recently does not eat in the morning and eats mostly 2 meals a day  Has been gradually gaining weight over the last couple of years and has not changed his diet           Side effects from medications have been: None  Compliance with the medical regimen: Fair Hypoglycemia: As above    Glucose monitoring:  done ?  1 times a day          Glucometer:  Accu-Chek .      Blood Glucose readings by time of day and averages from recall   PREMEAL Breakfast Lunch Dinner Bedtime  Overall   Glucose range: 75-130   ?   Median:        Self-care: The diet that the patient has been following is: tries to limit Drinks with sugar .     Meal times are:  Lunch: 12pm Dinner: 6-7   Typical meal intake: Breakfast is usually none               Dietician visit, most recent: Several years ago               Exercise:  walking 4-5 days a week, up to one hour  Weight history:  Wt Readings from Last 3 Encounters:  06/23/16 273 lb (123.8 kg)  11/11/13 260 lb (117.9 kg)    Glycemic control:   Lab Results  Component Value Date   HGBA1C 6.8 06/23/2016   HGBA1C (H) 02/05/2010    14.6 RESULT CALLED TO, READ BACK BY AND VERIFIED WITH: NELLIE BUCK,RN AT 2423 02/06/10 BY ZPERRY. (NOTE)  According to the ADA Clinical Practice Recommendations for 2011, when HbA1c is used as a screening test:   >=6.5%   Diagnostic of Diabetes Mellitus           (if abnormal result  is confirmed)  5.7-6.4%   Increased risk of developing Diabetes Mellitus  References:Diagnosis and Classification of Diabetes Mellitus,Diabetes DTOI,7124,58(KDXIP 1):S62-S69 and Standards of Medical Care in         Diabetes - 2011,Diabetes JASN,0539,76  (Suppl 1):S11-S61.   HGBA1C 6.7 (H) 05/21/2009   Lab Results  Component Value Date   MICROALBUR 0.56 01/09/2009   LDLCALC 117 (H) 01/09/2009   CREATININE 1.30 03/12/2011   No results found for: MICRALBCREAT  Microalbumin/creatinine ratio done by PCP normal in 1/17    Allergies as of 06/23/2016   No Known Allergies     Medication List       Accurate as of 06/23/16  3:03 PM. Always use your most recent med list.          aspirin 81 MG tablet Take 81 mg by mouth daily.   atorvastatin 10 MG tablet Commonly known as:  LIPITOR Take 10 mg by mouth  daily.   blood glucose meter kit and supplies Kit by Does not apply route. As directed   canagliflozin 100 MG Tabs tablet Commonly known as:  INVOKANA Take 1 tablet (100 mg total) by mouth daily before breakfast.   insulin glargine 100 UNIT/ML injection Commonly known as:  LANTUS Inject 48 Units into the skin at bedtime.   lisinopril 10 MG tablet Commonly known as:  PRINIVIL,ZESTRIL Take 10 mg by mouth daily.   metFORMIN 500 MG tablet Commonly known as:  GLUCOPHAGE Take 500 mg by mouth 2 (two) times daily with a meal.       Allergies: No Known Allergies  Past Medical History:  Diagnosis Date  . Diabetes mellitus, type II ([place])   . HLD (hyperlipidemia)   . HTN (hypertension)   . Obesity     No past surgical history on file.  Family History  Problem Relation Age of Onset  . Diabetes Mother   . Diabetes Father   . Diabetes Brother     Social History:  reports that he has never smoked. He has never used smokeless tobacco. He reports that he does not drink alcohol or use drugs.   Review of Systems  Constitutional: Positive for weight gain.  HENT: Negative for trouble swallowing.   Eyes:       He has had chronically decreased eyesight since childhood, does not know why  Respiratory: Negative for shortness of breath.   Cardiovascular: Negative for chest pain and leg swelling.  Gastrointestinal: Negative for constipation and abdominal pain.  Endocrine: Negative for fatigue and general weakness.  Genitourinary: Negative for frequency.  Musculoskeletal: Negative for joint pain and back pain.  Skin: Negative for rash.  Neurological: Negative for numbness and tingling.  Psychiatric/Behavioral: Negative for insomnia.     Lipid history: Recent LDL by PCP is 80, he is on Lipitor 10 mg daily    Lab Results  Component Value Date   CHOL 177 01/09/2009   HDL 45 01/09/2009   LDLCALC 117 (H) 01/09/2009   TRIG 76 01/09/2009   CHOLHDL 3.9 Ratio 01/09/2009             Hypertension: Taking only 10 mg lisinopril, BP was mildly increased at his PCP office visit last time  Most recent eye exam was 2016  Most recent foot  exam:1/18  He does take an aspirin daily, 81 mg     Physical Examination:  BP 126/86   Pulse 94   Ht 5' 9.25" (1.759 m)   Wt 273 lb (123.8 kg)   SpO2 100%   BMI 40.02 kg/m   GENERAL:         Patient has generalized obesity.   HEENT:         Eye exam shows normal external appearance.  He has mild puffiness of the eyes and face  Fundus exam : Unable to focus on the posterior segment Oral exam shows normal mucosa .  NECK:   There is no lymphadenopathy Thyroid is just palpable, slightly firm on the right side; no nodules felt.  Carotids are normal to palpation and no bruit Schroeder LUNGS:         Chest is symmetrical. Lungs are clear to auscultation.Marland Kitchen   HEART:         Heart sounds:  S1 and S2 are normal. No murmur or click Schroeder., no S3 or S4.   ABDOMEN:   There is no distention present.  Liver and spleen are not palpable. No other mass or tenderness present.   NEUROLOGICAL:   Ankle jerks are normal bilaterally.    Diabetic Foot Exam - Simple   Simple Foot Form Diabetic Foot exam was performed with the following findings:  Yes 06/23/2016  2:49 PM  Visual Inspection No deformities, no ulcerations, no other skin breakdown bilaterally:  Yes Sensation Testing Intact to touch and monofilament testing bilaterally:  Yes Pulse Check Posterior Tibialis and Dorsalis pulse intact bilaterally:  Yes Comments            Vibration sense is Mildly reduced in distal first toes. MUSCULOSKELETAL:  There is no swelling or deformity of the peripheral joints. Spine is normal to inspection.   EXTREMITIES:     There is no edema. No skin lesions present.Marland Kitchen SKIN:       No rash or lesions of concern.        ASSESSMENT:  Diabetes type 2, with obesity See history of present illness for detailed discussion of current diabetes management, blood sugar  patterns and problems identified  Although his last A1c is 6.8 not clear what his blood sugar patterns are due to lack of adequate monitoring and patient has poor recall of his readings, did not bring his monitor   He is probably having some tendency to hypoglycemia but difficult to get a good history from him,: Not clear how often he is having this. No history of severe hypoglycemia He has not brought his monitor, also checking blood sugars mostly in the morning He may have some post prandial hyperglycemia especially after his main meal in the evening where he is not monitoring Also likely to have some high readings with drinking regular soft drinks     Complications of diabetes: None evident  PLAN:    Stop drinking regular soft drinks  Consultation with dietitian  Start monitoring blood sugar every day with some readings after meals and every other day in the morning  He'll bring his monitor for download on the next visit He is a good candidate for an SGLT 2 drug. Discussed action of SGLT 2 drugs on lowering glucose by decreasing kidney absorption of glucose, benefits of weight loss and lower blood pressure, possible side effects including candidiasis and dosage regimen   Invokana 100 mg daily  Reduce insulin to 40 units twice a day for now  Continue metformin 2000 mg daily  Recheck kidney function and blood pressure on the next visit, currently blood pressure tends to be relatively high  More efforts to lose weight with regular exercise  Follow-up in 4 weeks  Patient Instructions  Check blood sugars on waking up 3-4x per week    Also check blood sugars about 2-3 hours after a meal and do this after different meals by rotation  Recommended blood sugar levels on waking up is 90-130 and about 2 hours after meal is 130-160  Please bring your blood sugar monitor to each visit, thank you  Reduce LANTUS TO 40 UNITS twice daily  No reg sodas  Invokana in am, new  pill    Consultation note has been sent to the referring physician  [facility] 06/23/2016, 3:03 PM   Note: This office note was prepared with Dragon voice recognition system technology. Any transcriptional errors that result from this process are unintentional.

## 2016-07-02 ENCOUNTER — Encounter: Payer: Medicare HMO | Attending: Endocrinology | Admitting: Dietician

## 2016-07-02 ENCOUNTER — Encounter: Payer: Self-pay | Admitting: Dietician

## 2016-07-02 DIAGNOSIS — Z794 Long term (current) use of insulin: Secondary | ICD-10-CM | POA: Diagnosis not present

## 2016-07-02 DIAGNOSIS — Z713 Dietary counseling and surveillance: Secondary | ICD-10-CM | POA: Insufficient documentation

## 2016-07-02 DIAGNOSIS — E118 Type 2 diabetes mellitus with unspecified complications: Secondary | ICD-10-CM

## 2016-07-02 DIAGNOSIS — E1165 Type 2 diabetes mellitus with hyperglycemia: Secondary | ICD-10-CM | POA: Insufficient documentation

## 2016-07-02 NOTE — Patient Instructions (Signed)
Great job on the changes that you have made!  Checking your blood sugar more often.  Drinking water or diet soda rather than regular Continue to stay active.  Walk most days for about 30-60 minutes. Keep peppermints in your pocket when you walk in case your blood sugar goes down. Continue to check your blood sugar. Choose baked, broiled or grilled more often than fried. Eat something for breakfast every day. Have a small amount of protein with your meal. Continue to watch your portion sizes.

## 2016-07-02 NOTE — Progress Notes (Signed)
Diabetes Self-Management Education  Visit Type: First/Initial  Appt. Start Time: 1400 Appt. End Time: [id number]  07/02/2016  [name], identified by name and date of birth, is a 61 y.o. male with a diagnosis of Diabetes: Type 2.  Other history includes HTN and hyperlipidemia.    Medications Metformin, Invokana, 40 units Lantus bid.    He is currently having problems with low blood sugar readings in the morning.  He does not eat breakfast.  He is resistant to eating breakfast because "this is the way I have always done".  He is trying to check his blood sugar more frequently.   He has stopped using regular soda and is drinking more water and reducing his portion sizes.  Patient lives alone.  His sister lives across the street and he goes to her house for meals often.  He is on disability.  ASSESSMENT  Height 5' 9.25" (1.759 m), weight 270 lb (122.5 kg). Body mass index is 39.58 kg/m.  Weight has decreased from 273 lbs 1 week ago.      Diabetes Self-Management Education - 07/02/16 1312      Visit Information   Visit Type First/Initial     Initial Visit   Diabetes Type Type 2   Are you currently following a meal plan? No   Are you taking your medications as prescribed? Yes   Date Diagnosed ?2008     Health Coping   How would you rate your overall health? Good     Psychosocial Assessment   Patient Belief/Attitude about Diabetes Motivated to manage diabetes   Self-care barriers None   Self-management support Doctor's office   Other persons present Patient   Patient Concerns Nutrition/Meal planning;Weight Control;Glycemic Control  "I just came because the doctor told me to."   Special Needs Large print   Preferred Learning Style Hands on   Learning Readiness Ready   How often do you need to have someone help you when you read instructions, pamphlets, or other written materials from your doctor or pharmacy? 1 - Never   What is the last grade level you completed in  school? 11th grade     Pre-Education Assessment   Patient understands the diabetes disease and treatment process. Needs Instruction   Patient understands incorporating nutritional management into lifestyle. Needs Instruction   Patient undertands incorporating physical activity into lifestyle. Needs Instruction   Patient understands using medications safely. Needs Instruction   Patient understands monitoring blood glucose, interpreting and using results Needs Instruction   Patient understands prevention, detection, and treatment of acute complications. Needs Instruction   Patient understands prevention, detection, and treatment of chronic complications. Needs Instruction   Patient understands how to develop strategies to address psychosocial issues. Needs Instruction   Patient understands how to develop strategies to promote health/change behavior. Needs Instruction     Complications   Last HgB A1C per patient/outside source 6.8 %  06/23/16   How often do you check your blood sugar? 3-4 times / week   Fasting Blood glucose range (mg/dL) <70;70-129   Postprandial Blood glucose range (mg/dL) 130-179;70-129   Number of hypoglycemic episodes per month 8   Can you tell when your blood sugar is low? Yes   What do you do if your blood sugar is low? eat a banana   Number of hyperglycemic episodes per week 0   Have you had a dilated eye exam in the past 12 months? Yes   Have you had a dental exam in  the past 12 months? No   Are you checking your feet? Yes   How many days per week are you checking your feet? 4     Dietary Intake   Breakfast banana only if blood sugar is low, often skips but occasional sausage biscuit from [employer], coffee with sweetened creamer occasionally with sugar   Snack (morning) none   Lunch Eats at his sister's if she is home (leftovers)   Snack (afternoon) none   Dinner often at his sister's- baked meat, vegetables, potatoes or pasta or other starch OR Peanut butter and  jelly or lunch meat and cheese sandwich on white bread   Snack (evening) occasional potato chips   Beverage(s) Used to drink 2-3 cans of regular soda per day but has now changed to diet soda, water     Exercise   Exercise Type Light (walking / raking leaves)   How many days per week to you exercise? 7  in good weather   How many minutes per day do you exercise? 60   Total minutes per week of exercise 420     Patient Education   Previous Diabetes Education --  "a long time ago"      Individualized Plan for Diabetes Self-Management Training:   Learning Objective:  Patient will have a greater understanding of diabetes self-management. Patient education plan is to attend individual and/or group sessions per assessed needs and concerns.   Plan:   Patient Instructions  Doristine Devoid job on the changes that you have made!  Checking your blood sugar more often.  Drinking water or diet soda rather than regular Continue to stay active.  Walk most days for about 30-60 minutes. Keep peppermints in your pocket when you walk in case your blood sugar goes down. Continue to check your blood sugar. Choose baked, broiled or grilled more often than fried. Eat something for breakfast every day. Have a small amount of protein with your meal. Continue to watch your portion sizes.    Expected Outcomes:   Patient demonstrated interest in learning.  Expect continued small changes.  Education material provided: Living Well with Diabetes, Food label handouts, A1C conversion sheet, Meal plan card and My Plate  If problems or questions, patient to contact team via:  Phone and Email  Future DSME appointment:  prn

## 2016-07-16 ENCOUNTER — Other Ambulatory Visit (INDEPENDENT_AMBULATORY_CARE_PROVIDER_SITE_OTHER): Payer: Medicare HMO

## 2016-07-16 DIAGNOSIS — E049 Nontoxic goiter, unspecified: Secondary | ICD-10-CM

## 2016-07-16 DIAGNOSIS — E1165 Type 2 diabetes mellitus with hyperglycemia: Secondary | ICD-10-CM

## 2016-07-16 DIAGNOSIS — Z794 Long term (current) use of insulin: Secondary | ICD-10-CM

## 2016-07-16 LAB — COMPREHENSIVE METABOLIC PANEL
ALT: 24 U/L (ref 0–53)
AST: 23 U/L (ref 0–37)
Albumin: 4.1 g/dL (ref 3.5–5.2)
Alkaline Phosphatase: 78 U/L (ref 39–117)
BUN: 14 mg/dL (ref 6–23)
CALCIUM: 9.3 mg/dL (ref 8.4–10.5)
CHLORIDE: 106 meq/L (ref 96–112)
CO2: 29 meq/L (ref 19–32)
Creatinine, Ser: 1.18 mg/dL (ref 0.40–1.50)
GFR: 80.88 mL/min (ref >60.00)
GLUCOSE: 71 mg/dL (ref 70–99)
Potassium: 5 mEq/L (ref 3.5–5.1)
Sodium: 140 mEq/L (ref 135–145)
Total Bilirubin: 0.4 mg/dL (ref 0.2–1.2)
Total Protein: 7.1 g/dL (ref 6.0–8.3)

## 2016-07-16 LAB — TSH: TSH: 2.9 u[IU]/mL (ref 0.35–4.50)

## 2016-07-21 ENCOUNTER — Encounter: Payer: Self-pay | Admitting: Endocrinology

## 2016-07-21 ENCOUNTER — Ambulatory Visit (INDEPENDENT_AMBULATORY_CARE_PROVIDER_SITE_OTHER): Payer: Medicare HMO | Admitting: Endocrinology

## 2016-07-21 VITALS — BP 128/84 | HR 85 | Ht 69.0 in | Wt 270.0 lb

## 2016-07-21 DIAGNOSIS — E1165 Type 2 diabetes mellitus with hyperglycemia: Secondary | ICD-10-CM | POA: Diagnosis not present

## 2016-07-21 DIAGNOSIS — Z794 Long term (current) use of insulin: Secondary | ICD-10-CM

## 2016-07-21 MED ORDER — CANAGLIFLOZIN 100 MG PO TABS: 100.0000 mg | ORAL_TABLET | Freq: Every day | ORAL | 1 refills | Status: DC

## 2016-07-21 NOTE — Patient Instructions (Addendum)
Check blood sugars on waking up 3-4 x per week   Also check blood sugars about 2 hours after a meal and do this after different meals by rotation  Recommended blood sugar levels on waking up is 90-130 and about 2 hours after meal is 130-160  Please bring your blood sugar monitor to each visit, thank you  LANTUS TO 34 UNITS IN PM AND 40 IN AM  LISINPRIL ONCE DAILY

## 2016-07-21 NOTE — Progress Notes (Signed)
Patient ID: Nicholas Schroeder, male   DOB: [birth year], 61 y.o.   MRN: [id number]            Reason for Appointment: Follow-up for Type 2 Diabetes  Referring physician: Suzanna Obey   History of Present Illness:          Date of diagnosis of type 2 diabetes mellitus: ? 2008        Background history:   Patient is unclear about when he was diagnosed to have diabetes and how his diagnosis was made Has been on metformin since diagnosis but not clear if he took other diabetes medications He was however started on insulin when his blood sugars are markedly increased in 2011 He thinks he has been on Lantus insulin all along with minimal change in doses  Recent history:   INSULIN regimen is: Lantus 40 bid with syringe       Non-insulin hypoglycemic drugs the patient is taking are: Metformin ER 2000 mg daily, Invokana 100 mg daily  Current management, blood sugar patterns and problems identified:  He has been taking Invokana since his initial consultation, has no adverse effects from this  He says he feels better now and his blood sugars appear to be more consistent.  However he still has occasional low normal or low sugars waking up in the morning, sometimes early in the morning  He has checked his blood sugar usually about twice a day some in the morning and some later in the day including after supper.  His blood sugars appear to be inconsistent after meals although on an average not consistently high the  No side effects with Invokana but has not lost any weight yet  However his monitor has incorrect date and time and appears to be about 6 hours ahead  Side effects from medications have been: None  Compliance with the medical regimen: Fair Hypoglycemia: As above    Glucose monitoring:  done ?  1 times a day         Glucometer:  Accu-Chek .      Blood Glucose readings by time of day and averages from recall  Mean values apply above for all meters except median for One  Touch  PRE-MEAL Fasting Lunch Dinner Bedtime Overall  Glucose range: 50-147   86-215   Mean/median: 100  145 153 129     Self-care: The diet that the patient has been following is: tries to limit Drinks with sugar .     Meal times are:  Lunch: 12pm Dinner: 6-7   Typical meal intake: Breakfast is usually none               Dietician visit, most recent: 07/02/16               Exercise:  walking 4-5 days a week, up to one hour  Weight history:  Wt Readings from Last 3 Encounters:  07/21/16 270 lb (122.5 kg)  07/02/16 270 lb (122.5 kg)  06/23/16 273 lb (123.8 kg)    Glycemic control:   Lab Results  Component Value Date   HGBA1C 6.8 06/23/2016   HGBA1C (H) 02/05/2010    14.6 RESULT CALLED TO, READ BACK BY AND VERIFIED WITH: NELLIE BUCK,RN AT 5631 02/06/10 BY ZPERRY. (NOTE)  According to the ADA Clinical Practice Recommendations for 2011, when HbA1c is used as a screening test:   >=6.5%   Diagnostic of Diabetes Mellitus           (if abnormal result  is confirmed)  5.7-6.4%   Increased risk of developing Diabetes Mellitus  References:Diagnosis and Classification of Diabetes Mellitus,Diabetes AJOI,7867,67(MCNOB 1):S62-S69 and Standards of Medical Care in         Diabetes - 2011,Diabetes SJGG,8366,29  (Suppl 1):S11-S61.   HGBA1C 6.7 (H) 05/21/2009   Lab Results  Component Value Date   MICROALBUR 0.56 01/09/2009   LDLCALC 117 (H) 01/09/2009   CREATININE 1.18 07/16/2016   No results found for: MICRALBCREAT  Microalbumin/creatinine ratio done by PCP normal in 1/17  No results found for: FRUCTOSAMINE      Allergies as of 07/21/2016   No Known Allergies     Medication List       Accurate as of 07/21/16  1:21 PM. Always use your most recent med list.          aspirin 81 MG tablet Take 81 mg by mouth daily.   atorvastatin 10 MG tablet Commonly known as:  LIPITOR Take 10 mg by mouth daily.   blood  glucose meter kit and supplies Kit by Does not apply route. As directed   canagliflozin 100 MG Tabs tablet Commonly known as:  INVOKANA Take 1 tablet (100 mg total) by mouth daily before breakfast.   insulin glargine 100 UNIT/ML injection Commonly known as:  LANTUS Inject 40 Units into the skin 2 (two) times daily.   lisinopril 10 MG tablet Commonly known as:  PRINIVIL,ZESTRIL Take 10 mg by mouth daily.   metFORMIN 500 MG tablet Commonly known as:  GLUCOPHAGE Take 500 mg by mouth 2 (two) times daily with a meal.       Allergies: No Known Allergies  Past Medical History:  Diagnosis Date  . Diabetes mellitus, type II ([place])   . HLD (hyperlipidemia)   . HTN (hypertension)   . Obesity     No past surgical history on file.  Family History  Problem Relation Age of Onset  . Diabetes Mother   . Diabetes Father   . Diabetes Brother     Social History:  reports that he has never smoked. He has never used smokeless tobacco. He reports that he does not drink alcohol or use drugs.   Review of Systems   Lipid history: Recent LDL by PCP is 80, he is on Lipitor 10 mg daily    Lab Results  Component Value Date   CHOL 177 01/09/2009   HDL 45 01/09/2009   LDLCALC 117 (H) 01/09/2009   TRIG 76 01/09/2009   CHOLHDL 3.9 Ratio 01/09/2009           Hypertension: Taking  10 mg lisinopril, He thinks he is taking this twice a day even though the prescription is for once a day BP At home 120/80  Most recent eye exam was 2016  Most recent foot exam:1/18  He does take an aspirin daily, 81 mg     Physical Examination:  BP 128/84   Pulse 85   Ht 5' 9"  (1.753 m)   Wt 270 lb (122.5 kg)   SpO2 92%   BMI 39.87 kg/m    ASSESSMENT:  Diabetes type 2, with obesity See history of present illness for detailed discussion of current diabetes management, blood sugar patterns and problems identified  His blood sugars are improving  with adding Invokana This is probably helping  his postprandial readings also since we are not consistently high He has just seen the dietitian and needs to be continuing to make changes in his meal planning and continue to avoid regular soft drinks  As he is having low normal or low readings early morning he can reduce his evening Lantus by 6 units now He will continue Invokana Since his potassium is high normal he can reduce lisinopril to once a day     Patient Instructions  Check blood sugars on waking up 3-4 x per week   Also check blood sugars about 2 hours after a meal and do this after different meals by rotation  Recommended blood sugar levels on waking up is 90-130 and about 2 hours after meal is 130-160  Please bring your blood sugar monitor to each visit, thank you  LANTUS TO 34 UNITS IN PM AND 40 IN AM  LISINPRIL ONCE DAILY     Ashlye Oviedo 07/21/2016, 1:21 PM   Note: This office note was prepared with Estate agent. Any transcriptional errors that result from this process are unintentional.

## 2016-09-17 ENCOUNTER — Other Ambulatory Visit (INDEPENDENT_AMBULATORY_CARE_PROVIDER_SITE_OTHER): Payer: Medicare HMO

## 2016-09-17 DIAGNOSIS — Z794 Long term (current) use of insulin: Secondary | ICD-10-CM

## 2016-09-17 DIAGNOSIS — E1165 Type 2 diabetes mellitus with hyperglycemia: Secondary | ICD-10-CM

## 2016-09-17 LAB — COMPREHENSIVE METABOLIC PANEL
ALK PHOS: 81 U/L (ref 39–117)
ALT: 24 U/L (ref 0–53)
AST: 21 U/L (ref 0–37)
Albumin: 4.1 g/dL (ref 3.5–5.2)
BUN: 17 mg/dL (ref 6–23)
CALCIUM: 9.5 mg/dL (ref 8.4–10.5)
CO2: 29 meq/L (ref 19–32)
Chloride: 107 mEq/L (ref 96–112)
Creatinine, Ser: 1.28 mg/dL (ref 0.40–1.50)
GFR: 73.59 mL/min (ref >60.00)
Glucose, Bld: 74 mg/dL (ref 70–99)
POTASSIUM: 5.2 meq/L — AB (ref 3.5–5.1)
Sodium: 141 mEq/L (ref 135–145)
Total Bilirubin: 0.4 mg/dL (ref 0.2–1.2)
Total Protein: 7 g/dL (ref 6.0–8.3)

## 2016-09-17 LAB — LIPID PANEL
Cholesterol: 120 mg/dL (ref 0–200)
HDL: 42.5 mg/dL (ref >39.00)
LDL Cholesterol: 64 mg/dL (ref 0–99)
NONHDL: 77.26
TRIGLYCERIDES: 68 mg/dL (ref 0.0–149.0)
Total CHOL/HDL Ratio: 3
VLDL: 13.6 mg/dL (ref 0.0–40.0)

## 2016-09-17 LAB — HEMOGLOBIN A1C: Hgb A1c MFr Bld: 6.7 % — ABNORMAL HIGH (ref 4.6–6.5)

## 2016-09-20 ENCOUNTER — Ambulatory Visit (INDEPENDENT_AMBULATORY_CARE_PROVIDER_SITE_OTHER): Payer: Medicare HMO | Admitting: Endocrinology

## 2016-09-20 ENCOUNTER — Encounter: Payer: Self-pay | Admitting: Endocrinology

## 2016-09-20 VITALS — BP 138/82 | HR 73 | Ht 69.0 in | Wt 262.8 lb

## 2016-09-20 DIAGNOSIS — E1165 Type 2 diabetes mellitus with hyperglycemia: Secondary | ICD-10-CM | POA: Diagnosis not present

## 2016-09-20 DIAGNOSIS — Z794 Long term (current) use of insulin: Secondary | ICD-10-CM | POA: Diagnosis not present

## 2016-09-20 MED ORDER — TELMISARTAN 20 MG PO TABS: 20.0000 mg | ORAL_TABLET | Freq: Every day | ORAL | 2 refills | Status: DC

## 2016-09-20 NOTE — Patient Instructions (Addendum)
Insulin 36 Lantus in am and 30 in pm  Check blood sugars on waking up  daily  Also check blood sugars about 2 hours after a meal and do this after different meals by rotation  Recommended blood sugar levels on waking up is 90-130 and about 2 hours after meal is 130-160  Please bring your blood sugar monitor to each visit, thank you  Stop Lisinopril

## 2016-09-20 NOTE — Progress Notes (Signed)
Patient ID: Nicholas Schroeder, male   DOB: 1955/11/28, 61 y.o.   MRN: [id number]            Reason for Appointment: Follow-up for Type 2 Diabetes  Referring physician: Suzanna Obey   History of Present Illness:          Date of diagnosis of type 2 diabetes mellitus: ? 2008        Background history:   Patient is unclear about when he was diagnosed to have diabetes and how his diagnosis was made Has been on metformin since diagnosis but not clear if he took other diabetes medications He was however started on insulin when his blood sugars are markedly increased in 2011 He thinks he has been on Lantus insulin all along with minimal change in doses  Recent history:   INSULIN regimen is: Lantus  40 in a.m. and 34 in p.m.  with syringe       Non-insulin hypoglycemic drugs the patient is taking are: Metformin ER 2000 mg daily, Invokana 100 mg daily  Current management, blood sugar patterns and problems identified:  He has been taking less insulin in the evening as directed but his FASTING blood sugars are still periodically low  More recently overall his blood sugars are not fluctuating much and mostly near normal  Still has some tendency to low sugars around 1-2 PM  He does check readings after supper and they do not appear to be high in the last 10 days although previously sporadically higher  He says he does not have symptoms of low sugars on waking up sometimes and mostly when he is getting low during the day  He has lost 8 pounds since his last visit  Overall doing well with walking when he can also   Side effects from medications have been: None  Compliance with the medical regimen: Fair Hypoglycemia: As above    Glucose monitoring:  done ?  1 times a day         Glucometer:  Accu-Chek .      Blood Glucose readings by time of day and averages from recall  Mean values apply above for all meters except median for One Touch  PRE-MEAL Fasting Lunch Dinner Bedtime  Overall  Glucose range: 52-1 63   58-1 78   86-1 58   87-241    Mean/median: 90  134  117  129  111    Self-care: The diet that the patient has been following is: tries to limit Drinks with sugar .     Meal times are:  Lunch: 12pm Dinner: 6-7   Typical meal intake: Breakfast is usually none               Dietician visit, most recent: 07/02/16               Exercise:  walking 4-5 days a week, up to one hour  Weight history:  Wt Readings from Last 3 Encounters:  09/20/16 262 lb 12.8 oz (119.2 kg)  07/21/16 270 lb (122.5 kg)  07/02/16 270 lb (122.5 kg)    Glycemic control:   Lab Results  Component Value Date   HGBA1C 6.7 (H) 09/17/2016   HGBA1C 6.8 06/23/2016   HGBA1C (H) 02/05/2010    14.6 RESULT CALLED TO, READ BACK BY AND VERIFIED WITH: NELLIE BUCK,RN AT 0109 02/06/10 BY ZPERRY. (NOTE)  According to the ADA Clinical Practice Recommendations for 2011, when HbA1c is used as a screening test:   >=6.5%   Diagnostic of Diabetes Mellitus           (if abnormal result  is confirmed)  5.7-6.4%   Increased risk of developing Diabetes Mellitus  References:Diagnosis and Classification of Diabetes Mellitus,Diabetes [id number],29(BMWUX 1):S62-S69 and Standards of Medical Care in         Diabetes - 2011,Diabetes [id number],0102,72  (Suppl 1):S11-S61.   Lab Results  Component Value Date   MICROALBUR 0.56 01/09/2009   LDLCALC 64 09/17/2016   CREATININE 1.28 09/17/2016   No results found for: MICRALBCREAT  Microalbumin/creatinine ratio done by PCP normal in 1/17  No results found for: FRUCTOSAMINE      Allergies as of 09/20/2016   No Known Allergies     Medication List       Accurate as of 09/20/16  1:03 PM. Always use your most recent med list.          aspirin 81 MG tablet Take 81 mg by mouth daily.   atorvastatin 10 MG tablet Commonly known as:  LIPITOR Take 10 mg by mouth daily.   blood glucose meter kit and  supplies Kit by Does not apply route. As directed   canagliflozin 100 MG Tabs tablet Commonly known as:  INVOKANA Take 1 tablet (100 mg total) by mouth daily before breakfast.   insulin glargine 100 UNIT/ML injection Commonly known as:  LANTUS Inject into the skin 2 (two) times daily. Give 40 units in the morning and 34 units at night   metFORMIN 500 MG tablet Commonly known as:  GLUCOPHAGE Take 500 mg by mouth 2 (two) times daily with a meal.   telmisartan 20 MG tablet Commonly known as:  MICARDIS Take 1 tablet (20 mg total) by mouth daily.       Allergies: No Known Allergies  Past Medical History:  Diagnosis Date  . Diabetes mellitus, type II ([place])   . HLD (hyperlipidemia)   . HTN (hypertension)   . Obesity     No past surgical history on file.  Family History  Problem Relation Age of Onset  . Diabetes Mother   . Diabetes Father   . Diabetes Brother     Social History:  reports that he has never smoked. He has never used smokeless tobacco. He reports that he does not drink alcohol or use drugs.   Review of Systems   Lipid history: Recent LDL by PCP is 80, he is on Lipitor 10 mg daily    Lab Results  Component Value Date   CHOL 120 09/17/2016   HDL 42.50 09/17/2016   LDLCALC 64 09/17/2016   TRIG 68.0 09/17/2016   CHOLHDL 3 09/17/2016           Hypertension: Taking  10 mg lisinopril, He thinks he is taking this   a day even though the prescription is for once a day BP At home 120/80  Most recent eye exam was 2016  Most recent foot exam:1/18  He does take an aspirin daily, 81 mg   Physical Examination:  BP 138/82 (Cuff Size: Normal)   Pulse 73   Ht 5' 9"  (1.753 m)   Wt 262 lb 12.8 oz (119.2 kg)   SpO2 95%   BMI 38.81 kg/m    ASSESSMENT:  Diabetes type 2, with obesity See history of present illness for detailed discussion of current diabetes management, blood sugar patterns and problems  identified  His blood sugars are improving  further with using Invokana and he appears to be needing less insulin He is also losing weight He has make changes in his diet after seeing the dietitian Recommended reducing his insulin doses by 4 units both morning and evening  He will continue Invokana  HYPERKALEMIA: Since his potassium is high at 5.2 he needs to switch lisinopril to Micardis 20 mg daily     Patient Instructions  Insulin 36 Lantus in am and 30 in pm  Check blood sugars on waking up  daily  Also check blood sugars about 2 hours after a meal and do this after different meals by rotation  Recommended blood sugar levels on waking up is 90-130 and about 2 hours after meal is 130-160  Please bring your blood sugar monitor to each visit, thank you  Stop Lisinopril      Eldora Napp 09/20/2016, 1:03 PM   Note: This office note was prepared with Dragon voice recognition system technology. Any transcriptional errors that result from this process are unintentional.

## 2016-11-02 ENCOUNTER — Other Ambulatory Visit (INDEPENDENT_AMBULATORY_CARE_PROVIDER_SITE_OTHER): Payer: Medicare HMO

## 2016-11-02 DIAGNOSIS — Z794 Long term (current) use of insulin: Secondary | ICD-10-CM

## 2016-11-02 DIAGNOSIS — E1165 Type 2 diabetes mellitus with hyperglycemia: Secondary | ICD-10-CM

## 2016-11-02 LAB — COMPREHENSIVE METABOLIC PANEL
ALT: 19 U/L (ref 0–53)
AST: 17 U/L (ref 0–37)
Albumin: 4.3 g/dL (ref 3.5–5.2)
Alkaline Phosphatase: 77 U/L (ref 39–117)
BUN: 21 mg/dL (ref 6–23)
CALCIUM: 9.8 mg/dL (ref 8.4–10.5)
CO2: 29 meq/L (ref 19–32)
CREATININE: 1.28 mg/dL (ref 0.40–1.50)
Chloride: 107 mEq/L (ref 96–112)
GFR: 73.56 mL/min (ref >60.00)
Glucose, Bld: 124 mg/dL — ABNORMAL HIGH (ref 70–99)
Potassium: 4.7 mEq/L (ref 3.5–5.1)
Sodium: 143 mEq/L (ref 135–145)
Total Bilirubin: 0.4 mg/dL (ref 0.2–1.2)
Total Protein: 7.1 g/dL (ref 6.0–8.3)

## 2016-11-02 LAB — MICROALBUMIN / CREATININE URINE RATIO
CREATININE, U: 99.1 mg/dL
MICROALB/CREAT RATIO: 0.7 mg/g (ref 0.0–30.0)

## 2016-11-02 LAB — HEMOGLOBIN A1C: Hgb A1c MFr Bld: 6.4 % (ref 4.6–6.5)

## 2016-11-03 LAB — FRUCTOSAMINE: FRUCTOSAMINE: 238 umol/L (ref 0–285)

## 2016-11-07 NOTE — Progress Notes (Signed)
Patient ID: Nicholas Schroeder, male   DOB: 1955/10/21, 61 y.o.   MRN: [id number]            Reason for Appointment: Follow-up for Type 2 Diabetes  Referring physician: Suzanna Obey   History of Present Illness:          Date of diagnosis of type 2 diabetes mellitus: ? 2008        Background history:   Patient is unclear about when he was diagnosed to have diabetes and how his diagnosis was made Has been on metformin since diagnosis but not clear if he took other diabetes medications He was however started on insulin when his blood sugars are markedly increased in 2011 He thinks he has been on Lantus insulin all along with minimal change in doses  Recent history:   INSULIN regimen is: Lantus  36 in a.m. and 30 in p.m.  with syringe       Non-insulin hypoglycemic drugs the patient is taking are: Metformin ER 2000 mg daily, Invokana 100 mg daily  His A1c is now 6.4, previously 6.7 and 6.8  Current management, blood sugar patterns and problems identified:  His Lantus was reduced to both morning and evening by 4 units on the last visit didn't early May  However  FASTING blood sugars are mostly below 100 and has been in the 60s a couple of times  He will also occasionally have a relatively low blood sugar in the early afternoon but less recently, has had a reading below 60 only once  Has only sporadic mildly increased blood sugars after meals at different times  Overall is still doing well with trying to exercise and his weight is down another 9 pounds  He is very compliant with checking his blood sugars up to 3 times a day   Side effects from medications have been: None  Compliance with the medical regimen: Fair Hypoglycemia: As above    Glucose monitoring:  done ?  1 times a day         Glucometer:  Accu-Chek .      Blood Glucose readings by time of day and averages from   Mean values apply above  all meters except median for One Touch  PRE-MEAL Fasting Lunch Dinner  Bedtime Overall  Glucose range: 60-126        Mean/median: 96     109   POST-MEAL PC Breakfast PC Lunch PC Dinner  Glucose range:  59-187 92-151   Mean/median:  112  119      Self-care: The diet that the patient has been following is: tries to limit Drinks with sugar .     Meal times are:  Lunch: 12pm Dinner: 6-7   Typical meal intake: Breakfast is usually none               Dietician visit, most recent: 07/02/16               Exercise:  walking 4-5 days a week, up to one hour  Weight history:  Wt Readings from Last 3 Encounters:  11/08/16 253 lb 12.8 oz (115.1 kg)  09/20/16 262 lb 12.8 oz (119.2 kg)  07/21/16 270 lb (122.5 kg)    Glycemic control:   Lab Results  Component Value Date   HGBA1C 6.4 11/02/2016   HGBA1C 6.7 (H) 09/17/2016   HGBA1C 6.8 06/23/2016   Lab Results  Component Value Date   MICROALBUR <0.7 11/02/2016   [place] 64 09/17/2016  CREATININE 1.28 11/02/2016   Lab Results  Component Value Date   MICRALBCREAT 0.7 11/02/2016    Microalbumin/creatinine ratio done by PCP normal in 1/17  Lab Results  Component Value Date   FRUCTOSAMINE 238 11/02/2016        Allergies as of 11/08/2016   No Known Allergies     Medication List       Accurate as of 11/08/16 12:35 PM. Always use your most recent med list.          aspirin 81 MG tablet Take 81 mg by mouth daily.   blood glucose meter kit and supplies Kit by Does not apply route. As directed   canagliflozin 100 MG Tabs tablet Commonly known as:  INVOKANA Take 1 tablet (100 mg total) by mouth daily before breakfast.   insulin glargine 100 UNIT/ML injection Commonly known as:  LANTUS Inject into the skin 2 (two) times daily. Give 36 units in the morning and 30 units at night   metFORMIN 500 MG tablet Commonly known as:  GLUCOPHAGE Take 1,000 mg by mouth 2 (two) times daily with a meal.   telmisartan 20 MG tablet Commonly known as:  MICARDIS Take 1 tablet (20 mg total) by mouth  daily.       Allergies: No Known Allergies  Past Medical History:  Diagnosis Date  . Diabetes mellitus, type II ([place])   . HLD (hyperlipidemia)   . HTN (hypertension)   . Obesity     No past surgical history on file.  Family History  Problem Relation Age of Onset  . Diabetes Mother   . Diabetes Father   . Diabetes Brother     Social History:  reports that he has never smoked. He has never used smokeless tobacco. He reports that he does not drink alcohol or use drugs.   Review of Systems   Lipid history: Recent LDL by PCP is 80, he is on Lipitor 10 mg daily    Lab Results  Component Value Date   CHOL 120 09/17/2016   HDL 42.50 09/17/2016   LDLCALC 64 09/17/2016   TRIG 68.0 09/17/2016   CHOLHDL 3 09/17/2016           Hypertension:  Previously on 10 mg lisinopril, now taking 20 MG telmisartan BP At home 118/76  Most recent eye exam was 2017  Most recent foot exam:1/18    Physical Examination:  BP 126/78 (Cuff Size: Large)   Pulse 73   Ht 5' 9"  (1.753 m)   Wt 253 lb 12.8 oz (115.1 kg)   SpO2 95%   BMI 37.48 kg/m    ASSESSMENT:  Diabetes type 2, with obesity See history of present illness for detailed discussion of current diabetes management, blood sugar patterns and problems identified  A1c is excellent at 6.4 He is still appearing to need progressively lower doses of insulin especially with his weight loss Blood sugars have been more stable only some fluctuation at different times of the day after meals Generally doing well with diet also and benefiting from taking Invokana  Recommended that he cut back his insulin doses another 4 units twice a day, new doses of be 32--28 on the Lantus  HYPERKALEMIA: Resolved with using telmisartan instead of lisinopril     Patient Instructions  Insulin 32 in am and 28 at dinner  Check blood sugars on waking up  3/7   Also check blood sugars about 2 hours after a meal and do this after different meals  by  rotation  Recommended blood sugar levels on waking up is 90-130 and about 2 hours after meal is 130-160  Please bring your blood sugar monitor to each visit, thank you      [facility] 11/08/2016, 12:35 PM   Note: This office note was prepared with Dragon voice recognition system technology. Any transcriptional errors that result from this process are unintentional.

## 2016-11-08 ENCOUNTER — Encounter: Payer: Self-pay | Admitting: Endocrinology

## 2016-11-08 ENCOUNTER — Ambulatory Visit (INDEPENDENT_AMBULATORY_CARE_PROVIDER_SITE_OTHER): Payer: Medicare HMO | Admitting: Endocrinology

## 2016-11-08 VITALS — BP 126/78 | HR 73 | Ht 69.0 in | Wt 253.8 lb

## 2016-11-08 DIAGNOSIS — E1165 Type 2 diabetes mellitus with hyperglycemia: Secondary | ICD-10-CM

## 2016-11-08 DIAGNOSIS — Z794 Long term (current) use of insulin: Secondary | ICD-10-CM

## 2016-11-08 NOTE — Patient Instructions (Signed)
Insulin 32 in am and 28 at dinner  Check blood sugars on waking up  3/7   Also check blood sugars about 2 hours after a meal and do this after different meals by rotation  Recommended blood sugar levels on waking up is 90-130 and about 2 hours after meal is 130-160  Please bring your blood sugar monitor to each visit, thank you

## 2016-12-02 ENCOUNTER — Other Ambulatory Visit: Payer: Self-pay | Admitting: Endocrinology

## 2017-01-04 ENCOUNTER — Other Ambulatory Visit (INDEPENDENT_AMBULATORY_CARE_PROVIDER_SITE_OTHER): Payer: Medicare HMO

## 2017-01-04 DIAGNOSIS — Z794 Long term (current) use of insulin: Secondary | ICD-10-CM | POA: Diagnosis not present

## 2017-01-04 DIAGNOSIS — E1165 Type 2 diabetes mellitus with hyperglycemia: Secondary | ICD-10-CM | POA: Diagnosis not present

## 2017-01-04 LAB — BASIC METABOLIC PANEL
BUN: 16 mg/dL (ref 6–23)
CALCIUM: 9.2 mg/dL (ref 8.4–10.5)
CO2: 30 mEq/L (ref 19–32)
Chloride: 106 mEq/L (ref 96–112)
Creatinine, Ser: 1.09 mg/dL (ref 0.40–1.50)
GFR: 88.49 mL/min (ref >60.00)
GLUCOSE: 146 mg/dL — AB (ref 70–99)
POTASSIUM: 4.7 meq/L (ref 3.5–5.1)
SODIUM: 142 meq/L (ref 135–145)

## 2017-01-05 LAB — FRUCTOSAMINE: FRUCTOSAMINE: 227 umol/L (ref 0–285)

## 2017-01-07 ENCOUNTER — Encounter: Payer: Self-pay | Admitting: Endocrinology

## 2017-01-07 ENCOUNTER — Ambulatory Visit (INDEPENDENT_AMBULATORY_CARE_PROVIDER_SITE_OTHER): Payer: Medicare HMO | Admitting: Endocrinology

## 2017-01-07 VITALS — BP 138/84 | HR 72 | Ht 69.0 in | Wt 253.6 lb

## 2017-01-07 DIAGNOSIS — Z794 Long term (current) use of insulin: Secondary | ICD-10-CM | POA: Diagnosis not present

## 2017-01-07 DIAGNOSIS — E1165 Type 2 diabetes mellitus with hyperglycemia: Secondary | ICD-10-CM | POA: Diagnosis not present

## 2017-01-07 NOTE — Patient Instructions (Addendum)
Check blood sugars on waking up  3/7  Also check blood sugars about 2 hours after a meal and do this after different meals by rotation  Recommended blood sugar levels on waking up is 90-130 and about 2 hours after meal is 130-160  Please bring your blood sugar monitor to each visit, thank you  Am insulin 30 units and pm 28

## 2017-01-07 NOTE — Progress Notes (Signed)
Patient ID: Nicholas Schroeder, male   DOB: [birth year], 61 y.o.   MRN: [id number]            Reason for Appointment: Follow-up for Type 2 Diabetes  Referring physician: Delbert Harness   History of Present Illness:          Date of diagnosis of type 2 diabetes mellitus: ? 2008        Background history:   Patient is unclear about when he was diagnosed to have diabetes and how his diagnosis was made Has been on metformin since diagnosis but not clear if he took other diabetes medications He was however started on insulin when his blood sugars are markedly increased in 2011 He thinks he has been on Lantus insulin all along with minimal change in doses  Recent history:   INSULIN regimen is: Lantus  33 in a.m. and 28 hs p.m.  with syringe       Non-insulin hypoglycemic drugs the patient is taking are: Metformin ER 2000 mg daily, Invokana 100 mg daily  His A1c is recently 6.4, previously 6.7 and 6.8  Current management, blood sugar patterns and problems identified:  His Lantus has been reduced progressively on each visit and blood sugars have been low normal  He has benefited significant leave from adding Invokana to his regimen  Although his fasting readings are not low recently as before his blood sugars may tend to be lower midday and afternoon now  He does not eat breakfast usually and only has coffee in the morning  He is taking his Lantus usually on waking up and at bedtime  He is very compliant with checking his blood sugars up to 3 times a day including some after evening meal also  His weight has however leveled off, previously had lost 9 pounds  He thinks he is not getting higher fat foods usually   Side effects from medications have been: None  Compliance with the medical regimen: Fair Hypoglycemia: As above    Glucose monitoring:  done ?  1 times a day         Glucometer:  Accu-Chek .      Blood Glucose readings by time of day and averages from   Mean values  apply above for all meters except median for One Touch  PRE-MEAL Fasting Lunch Dinner PCS  Overall  Glucose range:  80-1 85  59-165   76-1 33  85-1 86    Mean/median: 114  123   124  113      Self-care: The diet that the patient has been following is: tries to limit Drinks with sugar  .     Meal times are:  Lunch: 12pm Dinner: 6-7   Typical meal intake: Breakfast is usually none               Dietician visit, most recent: 07/02/16               Exercise:  walking 5 days a week, up to one hour  Weight history:  Wt Readings from Last 3 Encounters:  01/07/17 253 lb 9.6 oz (115 kg)  11/08/16 253 lb 12.8 oz (115.1 kg)  09/20/16 262 lb 12.8 oz (119.2 kg)    Glycemic control:   Lab Results  Component Value Date   HGBA1C 6.4 11/02/2016   HGBA1C 6.7 (H) 09/17/2016   HGBA1C 6.8 06/23/2016   Lab Results  Component Value Date   MICROALBUR <0.7 11/02/2016   LDLCALC  64 09/17/2016   CREATININE 1.09 01/04/2017   Lab Results  Component Value Date   MICRALBCREAT 0.7 11/02/2016      Lab Results  Component Value Date   FRUCTOSAMINE 227 01/04/2017   FRUCTOSAMINE 238 11/02/2016        Allergies as of 01/07/2017   No Known Allergies     Medication List       Accurate as of 01/07/17 12:45 PM. Always use your most recent med list.          aspirin 81 MG tablet Take 81 mg by mouth daily.   blood glucose meter kit and supplies Kit by Does not apply route. As directed   insulin glargine 100 UNIT/ML injection Commonly known as:  LANTUS Inject into the skin 2 (two) times daily. Give 36 units in the morning and 30 units at night   INVOKANA 100 MG Tabs tablet Generic drug:  canagliflozin TAKE 1 TABLET (100 MG TOTAL) BY MOUTH DAILY BEFORE BREAKFAST.   metFORMIN 500 MG tablet Commonly known as:  GLUCOPHAGE Take 1,000 mg by mouth 2 (two) times daily with a meal.   telmisartan 20 MG tablet Commonly known as:  MICARDIS Take 1 tablet (20 mg total) by mouth daily.        Allergies: No Known Allergies  Past Medical History:  Diagnosis Date  . Diabetes mellitus, type II ([place])   . HLD (hyperlipidemia)   . HTN (hypertension)   . Obesity     No past surgical history on file.  Family History  Problem Relation Age of Onset  . Diabetes Mother   . Diabetes Father   . Diabetes Brother     Social History:  reports that he has never smoked. He has never used smokeless tobacco. He reports that he does not drink alcohol or use drugs.   Review of Systems   Lipid history: LDL Controlled, he is on Lipitor 10 mg daily    Lab Results  Component Value Date   CHOL 120 09/17/2016   HDL 42.50 09/17/2016   LDLCALC 64 09/17/2016   TRIG 68.0 09/17/2016   CHOLHDL 3 09/17/2016           Hypertension:  Previously on 10 mg lisinopril, now taking 20 MG telmisartan BP At home: Does not remember  BP Readings from Last 3 Encounters:  01/07/17 138/84  11/08/16 126/78  09/20/16 138/82     Most recent eye exam was 2017  Most recent foot exam:1/18    Physical Examination:  BP 138/84 (BP Location: Left Arm, Cuff Size: Normal)   Pulse 72   Ht _0  (1.753 m)   Wt 253 lb 9.6 oz (115 kg)   SpO2 97%   BMI 37.45 kg/m    ASSESSMENT:  Diabetes type 2, with obesity See history of present illness for detailed discussion of current diabetes management, blood sugar patterns and problems identified  He is here for short-term follow-up on his last A1c was 6.4  He is not having hypoglycemia waking up but has a couple of low sugars midday and lowest blood sugars maybe in the afternoon Has only occasional mildly increased postprandial readings and does not appear to need mealtime shots  This is with his regimen of Invokana, metformin and twice a day Lantus  He is trying to walk but has not lost any further weight Maybe to lose a little weight with avoiding tendency to hypoglycemia   Recommended that he cut back his insulin doses further and  he will  take 30 units in the morning and continue 28 units Lantus in the evening  Hypertension: Well controlled    Patient Instructions  Check blood sugars on waking up  3/7  Also check blood sugars about 2 hours after a meal and do this after different meals by rotation  Recommended blood sugar levels on waking up is 90-130 and about 2 hours after meal is 130-160  Please bring your blood sugar monitor to each visit, thank you  Am insulin 30 units and pm 28      Nicholas Schroeder 01/07/2017, 12:45 PM   Note: This office note was prepared with Estate agent. Any transcriptional errors that result from this process are unintentional.

## 2017-01-18 ENCOUNTER — Other Ambulatory Visit: Payer: Self-pay

## 2017-01-18 ENCOUNTER — Telehealth: Payer: Self-pay | Admitting: Endocrinology

## 2017-01-18 MED ORDER — INSULIN GLARGINE 100 UNIT/ML ~~LOC~~ SOLN: SUBCUTANEOUS | 1 refills | Status: DC

## 2017-01-18 NOTE — Telephone Encounter (Signed)
Called patient and let him know that I am sending in the Lantus insulin with the instructions per Dr. Dwyane Dee to the [facility] mail service for him.

## 2017-01-18 NOTE — Telephone Encounter (Signed)
Patient's PCP said he needs to start getting his insulin medication from Dr. Dwyane Dee.  So he needs a refill.

## 2017-03-31 ENCOUNTER — Other Ambulatory Visit: Payer: Self-pay

## 2017-03-31 ENCOUNTER — Ambulatory Visit (HOSPITAL_COMMUNITY)
Admission: EM | Admit: 2017-03-31 | Discharge: 2017-03-31 | Disposition: A | Discharge status: Home / Self Care | Payer: Medicare HMO | Attending: Internal Medicine | Admitting: Internal Medicine

## 2017-03-31 ENCOUNTER — Encounter (HOSPITAL_COMMUNITY): Payer: Self-pay | Admitting: Emergency Medicine

## 2017-03-31 DIAGNOSIS — H1031 Unspecified acute conjunctivitis, right eye: Secondary | ICD-10-CM | POA: Diagnosis not present

## 2017-03-31 MED ORDER — FLUORESCEIN SODIUM 0.6 MG OP STRP
ORAL_STRIP | OPHTHALMIC | Status: AC
  Filled 2017-03-31: qty 1

## 2017-03-31 MED ORDER — TETRACAINE HCL 0.5 % OP SOLN
OPHTHALMIC | Status: AC
  Filled 2017-03-31: qty 4

## 2017-03-31 NOTE — Discharge Instructions (Signed)
Please go to appointment with [facility] today at 3:15, bring your insurance card.

## 2017-03-31 NOTE — ED Provider Notes (Signed)
[place]    CSN: [id number] Arrival date & time: 03/31/17  1219     History   Chief Complaint Chief Complaint  Patient presents with  . Eye Problem    HPI Nicholas Schroeder is a 61 y.o. male.   Nicholas Schroeder presents with complaints of right eye soreness redness and irritation which has been ongoing for the past week. He feels that he has drainage in the morning when he wakes but has not visualized it. He does not feel like it has worsened. Pain to eye ball , "soreness" if he presses onto it. States it feels like there is dust in his eye. No exposures or known foreign bodies. He does wear glasses. Does not wear contacts. Has not been to his eye doctor in years due to cost. 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 Has not tried any treatments for it. Denies any URI symptoms.    ROS per HPI.       Past Medical History:  Diagnosis Date  . Diabetes mellitus, type II ([place])   . HLD (hyperlipidemia)   . HTN (hypertension)   . Obesity     Patient Active Problem List   Diagnosis Date Noted  . Diabetes mellitus, type II ([place])   . HTN (hypertension)   . HLD (hyperlipidemia)   . Obesity     Past Surgical History:  Procedure Laterality Date  . ABDOMINAL SURGERY         Home Medications    Prior to Admission medications   Medication Sig Start Date End Date Taking? Authorizing Provider  OVER THE COUNTER MEDICATION    Yes [provider]  aspirin 81 MG tablet Take 81 mg by mouth daily.    [provider]  Blood Glucose Monitoring Suppl (BLOOD GLUCOSE MONITOR KIT) KIT by Does not apply route. As directed    [provider]  insulin glargine (LANTUS) 100 UNIT/ML injection Inject 30 units in the morning and inject 28 units at night 01/18/17   Elayne Snare, MD  INVOKANA 100 MG TABS tablet TAKE 1 TABLET (100 MG TOTAL) BY MOUTH DAILY BEFORE BREAKFAST. 12/02/16    Elayne Snare, MD  metFORMIN (GLUCOPHAGE) 500 MG tablet Take 1,000 mg by mouth 2 (two) times daily with a meal.     [provider]  telmisartan (MICARDIS) 20 MG tablet Take 1 tablet (20 mg total) by mouth daily. 09/20/16   Elayne Snare, MD    Family History Family History  Problem Relation Age of Onset  . Diabetes Mother   . Diabetes Father   . Diabetes Brother     Social History Social History   Tobacco Use  . Smoking status: Never Smoker  . Smokeless tobacco: Never Used  Substance Use Topics  . Alcohol use: No  . Drug use: No     Allergies   Patient has no known allergies.   Review of Systems Review of Systems   Physical Exam Triage Vital Signs ED Triage Vitals  Enc Vitals Group     BP 03/31/17 1245 (!) 147/88     Pulse Rate 03/31/17 1245 91     Resp 03/31/17 1245 20     Temp 03/31/17 1245 98.5 F (36.9 C)     Temp Source 03/31/17 1245 Oral     SpO2 03/31/17 1245 97 %     Weight --  Height --      Head Circumference --      Peak Flow --      Pain Score 03/31/17 1242 7     Pain Loc --      Pain Edu? --      Excl. in [place]? --    No data found.  Updated Vital Signs BP (!) 147/88 (BP Location: Right Arm)   Pulse 91   Temp 98.5 F (36.9 C) (Oral)   Resp 20   SpO2 97%   Visual Acuity Right Eye Distance:   Left Eye Distance:   Bilateral Distance: (unable to make out any letter)  Right Eye Near:   Left Eye Near:    Bilateral Near:     Physical Exam  Constitutional: He appears well-developed and well-nourished. No distress.  Eyes: EOM and lids are normal. No foreign body present in the right eye. Right conjunctiva is injected. Right pupil is not round and not reactive. Left pupil is round and reactive.  Slit lamp exam:      The right eye shows no corneal abrasion and no fluorescein uptake.  Right eye pupil without light reaction and irregular shaped; patient does not know if this is baseline for him. Pupil is not fully dilated    Cardiovascular: Normal rate and regular rhythm.  Pulmonary/Chest: Effort normal and breath sounds normal.  Vitals reviewed.    UC Treatments / Results  Labs (all labs ordered are listed, but only abnormal results are displayed) Labs Reviewed - No data to display  EKG  EKG Interpretation None       Radiology No results found.  Procedures Procedures (including critical care time)  Medications Ordered in UC Medications - No data to display   Initial Impression / Assessment and Plan / UC Course  I have reviewed the triage vital signs and the nursing notes.  Pertinent labs & imaging results that were available during my care of the patient were reviewed by me and considered in my medical decision making (see chart for details).     Right pupil with abnormal findings, as well as with eye ball soreness and redness. Call made to patient's eye doctor and appointment made for 1.5 hours from now, to be seen. Recommended to patient that a complete eye exam is done today for further evaluation. Discussed concern of cost with eye clinic and with patient and [name] states they will be able to work with patient on cost. Patient agreeable to go to appointment today and states that he has had a ride.   Final Clinical Impressions(s) / UC Diagnoses   Final diagnoses:  Acute conjunctivitis of right eye, unspecified acute conjunctivitis type    ED Discharge Orders    None       Controlled Substance Prescriptions [place] Controlled Substance Registry consulted? Not Applicable   Zigmund Gottron, NP 03/31/17 1352

## 2017-03-31 NOTE — ED Triage Notes (Signed)
One week ago, woke with right eye redness, painful, feeling like dirt is in eye.  Eye is sore.  Patient says he has noticed a change in vision "not clear"

## 2017-04-05 ENCOUNTER — Other Ambulatory Visit (INDEPENDENT_AMBULATORY_CARE_PROVIDER_SITE_OTHER): Payer: Medicare HMO

## 2017-04-05 DIAGNOSIS — Z794 Long term (current) use of insulin: Secondary | ICD-10-CM

## 2017-04-05 DIAGNOSIS — E1165 Type 2 diabetes mellitus with hyperglycemia: Secondary | ICD-10-CM

## 2017-04-05 LAB — COMPREHENSIVE METABOLIC PANEL
ALK PHOS: 82 U/L (ref 39–117)
ALT: 14 U/L (ref 0–53)
AST: 14 U/L (ref 0–37)
Albumin: 4.3 g/dL (ref 3.5–5.2)
BILIRUBIN TOTAL: 0.5 mg/dL (ref 0.2–1.2)
BUN: 17 mg/dL (ref 6–23)
CO2: 31 mEq/L (ref 19–32)
CREATININE: 1.1 mg/dL (ref 0.40–1.50)
Calcium: 9.6 mg/dL (ref 8.4–10.5)
Chloride: 105 mEq/L (ref 96–112)
GFR: 87.49 mL/min (ref >60.00)
GLUCOSE: 77 mg/dL (ref 70–99)
POTASSIUM: 5.1 meq/L (ref 3.5–5.1)
SODIUM: 141 meq/L (ref 135–145)
TOTAL PROTEIN: 7.2 g/dL (ref 6.0–8.3)

## 2017-04-05 LAB — HEMOGLOBIN A1C: Hgb A1c MFr Bld: 6.8 % — ABNORMAL HIGH (ref 4.6–6.5)

## 2017-04-11 ENCOUNTER — Ambulatory Visit: Payer: Medicare HMO | Admitting: Endocrinology

## 2017-04-11 ENCOUNTER — Encounter: Payer: Self-pay | Admitting: Endocrinology

## 2017-04-11 VITALS — BP 140/86 | HR 71 | Ht 69.0 in | Wt 261.6 lb

## 2017-04-11 DIAGNOSIS — E1165 Type 2 diabetes mellitus with hyperglycemia: Secondary | ICD-10-CM

## 2017-04-11 DIAGNOSIS — Z794 Long term (current) use of insulin: Secondary | ICD-10-CM | POA: Diagnosis not present

## 2017-04-11 DIAGNOSIS — Z23 Encounter for immunization: Secondary | ICD-10-CM

## 2017-04-11 NOTE — Patient Instructions (Signed)
Check blood sugars on waking up  4/7  Also check blood sugars about 2 hours after a meal and do this after different meals by rotation  Recommended blood sugar levels on waking up is 90-130 and about 2 hours after meal is 130-160  Please bring your blood sugar monitor to each visit, thank you  Walk daily  

## 2017-04-11 NOTE — Progress Notes (Signed)
Patient ID: Nicholas Schroeder, male   DOB: 06-Nov-1955, 61 y.o.   MRN: [id number]            Reason for Appointment: Follow-up for Type 2 Diabetes  Referring physician: Suzanna Obey   History of Present Illness:          Date of diagnosis of type 2 diabetes mellitus: ? 2008        Background history:   Patient is unclear about when he was diagnosed to have diabetes and how his diagnosis was made Has been on metformin since diagnosis but not clear if he took other diabetes medications He was however started on insulin when his blood sugars are markedly increased in 2011 He thinks he has been on Lantus insulin all along with minimal change in doses  Recent history:   INSULIN regimen is: Lantus  30 in a.m. and 28 hs p.m.  with syringe       Non-insulin hypoglycemic drugs the patient is taking are: Metformin ER 2000 mg daily, Invokana 100 mg daily  His A1c is now 6.8, previously range 6.4-6.8  Current management, blood sugar patterns and problems identified:  His Lantus has been reduced progressively on each visit, his morning dose was reduced by 3 units on his last visit  Although he has some low normal blood sugars in the mornings these are only occasionally and not consistent  Lab glucose was 77 but he took his insulin and did not eat that morning  He is good about checking his postprandial readings after lunch and supper  Most of the time he is able to keep her sugars controlled but occasionally he'll get into regular soft drinks other new sugars to be over 200 at night  Because of change in weather he has not done his walking which she was doing about 5 hours a week previously  As a result his weight has gone up significantly   Side effects from medications have been: None  Compliance with the medical regimen: Fair Hypoglycemia: As above    Glucose monitoring:  done up to 2 times a day         Glucometer:  Accu-Chek .      Blood Glucose readings by time of day and  averages from download  Mean values apply above for all meters except median for One Touch  PRE-MEAL Fasting Lunch Dinner Bedtime Overall  Glucose range:  70-1 76       Mean/median: 121     139    POST-MEAL PC Breakfast PC Lunch PC Dinner  Glucose range:    10 6-253   Mean/median:  128  169       Self-care: The diet that the patient has been following is: tries to limit Drinks with sugar  .     Meal times are:  Lunch: 12pm Dinner: 6-7   Typical meal intake: Breakfast is usually none               Dietician visit, most recent: 07/02/16               Exercise:  walking 0-5 days a week, up to one hour  Weight history:  Wt Readings from Last 3 Encounters:  04/11/17 261 lb 9.6 oz (118.7 kg)  01/07/17 253 lb 9.6 oz (115 kg)  11/08/16 253 lb 12.8 oz (115.1 kg)    Glycemic control:   Lab Results  Component Value Date   HGBA1C 6.8 (H) 04/05/2017  HGBA1C 6.4 11/02/2016   HGBA1C 6.7 (H) 09/17/2016   Lab Results  Component Value Date   MICROALBUR <0.7 11/02/2016   LDLCALC 64 09/17/2016   CREATININE 1.10 04/05/2017   Lab Results  Component Value Date   MICRALBCREAT 0.7 11/02/2016      Lab Results  Component Value Date   FRUCTOSAMINE 227 01/04/2017   FRUCTOSAMINE 238 11/02/2016        Allergies as of 04/11/2017   No Known Allergies     Medication List        Accurate as of 04/11/17  1:31 PM. Always use your most recent med list.          aspirin 81 MG tablet Take 81 mg by mouth daily.   blood glucose meter kit and supplies Kit by Does not apply route. As directed   insulin glargine 100 UNIT/ML injection Commonly known as:  LANTUS Inject 30 units in the morning and inject 28 units at night   INVOKANA 100 MG Tabs tablet Generic drug:  canagliflozin TAKE 1 TABLET (100 MG TOTAL) BY MOUTH DAILY BEFORE BREAKFAST.   metFORMIN 500 MG tablet Commonly known as:  GLUCOPHAGE Take 1,000 mg by mouth 2 (two) times daily with a meal.   OVER THE COUNTER  MEDICATION   telmisartan 20 MG tablet Commonly known as:  MICARDIS Take 1 tablet (20 mg total) by mouth daily.       Allergies: No Known Allergies  Past Medical History:  Diagnosis Date  . Diabetes mellitus, type II ([place])   . HLD (hyperlipidemia)   . HTN (hypertension)   . Obesity     Past Surgical History:  Procedure Laterality Date  . ABDOMINAL SURGERY      Family History  Problem Relation Age of Onset  . Diabetes Mother   . Diabetes Father   . Diabetes Brother     Social History:  reports that  has never smoked. he has never used smokeless tobacco. He reports that he does not drink alcohol or use drugs.   Review of Systems   Lipid history: LDL Controlled, he is on Lipitor 10 mg daily    Lab Results  Component Value Date   CHOL 120 09/17/2016   HDL 42.50 09/17/2016   LDLCALC 64 09/17/2016   TRIG 68.0 09/17/2016   CHOLHDL 3 09/17/2016           Hypertension:  Previously on 10 mg lisinopril, now taking 20 MG telmisartan Followed by PCP  BP Readings from Last 3 Encounters:  04/11/17 140/86  03/31/17 (!) 147/88  01/07/17 138/84     Most recent eye exam was 2017  Most recent foot exam:1/18    Physical Examination:  BP 140/86   Pulse 71   Ht 5' 9"  (1.753 m)   Wt 261 lb 9.6 oz (118.7 kg)   SpO2 96%   BMI 38.63 kg/m    ASSESSMENT:  Diabetes type 2, with obesity See history of present illness for detailed discussion of current diabetes management, blood sugar patterns and problems identified  His A1c is still fairly good at 6.8 although previously better This may have been from tendency to hypoglycemia in the past which is not present now Occasionally has low normal fasting readings He has gained weight and this is likely to be from lack of exercise and walking Also can do a little better with his diet especially avoiding drinks with sugar in the evening  He is compliant with his regimen of Invokana,  metformin and twice a day  Lantus   HYPERTENSION: He needs to follow-up with his PCP for this  Influenza vaccine given    Patient Instructions  Check blood sugars on waking up 4/7   Also check blood sugars about 2 hours after a meal and do this after different meals by rotation  Recommended blood sugar levels on waking up is 90-130 and about 2 hours after meal is 130-160  Please bring your blood sugar monitor to each visit, thank you  Walk daily      Aundraya Dripps 04/11/2017, 1:31 PM   Note: This office note was prepared with Dragon voice recognition system technology. Any transcriptional errors that result from this process are unintentional.

## 2017-04-25 ENCOUNTER — Other Ambulatory Visit: Payer: Self-pay | Admitting: Endocrinology

## 2017-06-06 ENCOUNTER — Other Ambulatory Visit: Payer: Self-pay | Admitting: Endocrinology

## 2017-07-07 ENCOUNTER — Other Ambulatory Visit (INDEPENDENT_AMBULATORY_CARE_PROVIDER_SITE_OTHER): Payer: Medicare HMO

## 2017-07-07 DIAGNOSIS — Z794 Long term (current) use of insulin: Secondary | ICD-10-CM | POA: Diagnosis not present

## 2017-07-07 DIAGNOSIS — E1165 Type 2 diabetes mellitus with hyperglycemia: Secondary | ICD-10-CM

## 2017-07-07 LAB — COMPREHENSIVE METABOLIC PANEL
ALK PHOS: 86 U/L (ref 39–117)
ALT: 21 U/L (ref 0–53)
AST: 24 U/L (ref 0–37)
Albumin: 4.3 g/dL (ref 3.5–5.2)
BILIRUBIN TOTAL: 0.4 mg/dL (ref 0.2–1.2)
BUN: 17 mg/dL (ref 6–23)
CALCIUM: 9.7 mg/dL (ref 8.4–10.5)
CO2: 31 mEq/L (ref 19–32)
Chloride: 103 mEq/L (ref 96–112)
Creatinine, Ser: 1.24 mg/dL (ref 0.40–1.50)
GFR: 76.13 mL/min (ref >60.00)
GLUCOSE: 126 mg/dL — AB (ref 70–99)
Potassium: 5.2 mEq/L — ABNORMAL HIGH (ref 3.5–5.1)
Sodium: 138 mEq/L (ref 135–145)
TOTAL PROTEIN: 7.5 g/dL (ref 6.0–8.3)

## 2017-07-07 LAB — HEMOGLOBIN A1C: Hgb A1c MFr Bld: 6.8 % — ABNORMAL HIGH (ref 4.6–6.5)

## 2017-07-12 ENCOUNTER — Encounter: Payer: Self-pay | Admitting: Endocrinology

## 2017-07-12 ENCOUNTER — Ambulatory Visit: Payer: Medicare HMO | Admitting: Endocrinology

## 2017-07-12 VITALS — BP 124/84 | HR 87 | Ht 69.0 in | Wt 262.6 lb

## 2017-07-12 DIAGNOSIS — Z794 Long term (current) use of insulin: Secondary | ICD-10-CM | POA: Diagnosis not present

## 2017-07-12 DIAGNOSIS — E1165 Type 2 diabetes mellitus with hyperglycemia: Secondary | ICD-10-CM | POA: Diagnosis not present

## 2017-07-12 NOTE — Progress Notes (Signed)
Patient ID: Nicholas Schroeder, male   DOB: 06-Jul-1955, 62 y.o.   MRN: [id number]            Reason for Appointment: Follow-up for Type 2 Diabetes  Referring physician: Suzanna Obey   History of Present Illness:          Date of diagnosis of type 2 diabetes mellitus: ? 2008        Background history:   Patient is unclear about when he was diagnosed to have diabetes and how his diagnosis was made Has been on metformin since diagnosis but not clear if he took other diabetes medications He was however started on insulin when his blood sugars are markedly increased in 2011 He thinks he has been on Lantus insulin all along with minimal change in doses  Recent history:   INSULIN regimen is: Lantus  30 in a.m. and 28 hs p.m.  with syringe       Non-insulin hypoglycemic drugs the patient is taking are: Metformin ER 2000 mg daily, Invokana 100 mg daily  His A1c is again 6.8, previously range 6.4-6.8  Current management, blood sugar patterns and problems identified:  His insulin doses have been plateaued and has not been changed recently  He is doing an excellent job of monitoring his blood sugars by rotation at various times after meals and also in the morning  Not clear why he has some variability in his blood sugars but likely related to inconsistent diet  Also no hypoglycemia currently with his reduced dose of Lantus  He has had good compliance with taking his oral medications including Invokana also  His weight has leveled off, previously had gained weight  He is trying to do some walking but not as much because of various issues   Side effects from medications have been: None  Compliance with the medical regimen: Improved  Glucose monitoring:  done up to 2 times a day         Glucometer:  Accu-Chek .      Blood Glucose readings by time of day and averages from download  Mean values apply above for all meters except median for One Touch  PRE-MEAL Fasting Lunch Dinner  Bedtime Overall  Glucose range: 82-179       Mean/median: 122    134    POST-MEAL PC Breakfast PC Lunch PC Dinner  Glucose range:  88-207  113-329   Mean/median:  133  154  '   Mean values apply above for all meters except median for One Touch  PRE-MEAL Fasting Lunch Dinner Bedtime Overall  Glucose range:  70-1 76       Mean/median: 121     139    POST-MEAL PC Breakfast PC Lunch PC Dinner  Glucose range:    10 6-253   Mean/median:  128  169       Self-care: The diet that the patient has been following is: tries to limit Drinks with sugar  .     Meal times are:  Lunch: 12pm Dinner: 6-7   Typical meal intake: Breakfast is usually none               Dietician visit, most recent: 07/02/16               Exercise:  walking a little  Weight history:  Wt Readings from Last 3 Encounters:  07/12/17 262 lb 9.6 oz (119.1 kg)  04/11/17 261 lb 9.6 oz (118.7 kg)  01/07/17 253  lb 9.6 oz (115 kg)    Glycemic control:   Lab Results  Component Value Date   HGBA1C 6.8 (H) 07/07/2017   HGBA1C 6.8 (H) 04/05/2017   HGBA1C 6.4 11/02/2016   Lab Results  Component Value Date   MICROALBUR <0.7 11/02/2016   LDLCALC 64 09/17/2016   CREATININE 1.24 07/07/2017   Lab Results  Component Value Date   MICRALBCREAT 0.7 11/02/2016      Lab Results  Component Value Date   FRUCTOSAMINE 227 01/04/2017   FRUCTOSAMINE 238 11/02/2016        Allergies as of 07/12/2017   No Known Allergies     Medication List        Accurate as of 07/12/17  9:53 AM. Always use your most recent med list.          aspirin 81 MG tablet Take 81 mg by mouth daily.   blood glucose meter kit and supplies Kit by Does not apply route. As directed   insulin glargine 100 UNIT/ML injection Commonly known as:  LANTUS INJECT 30 UNITS IN THE MORNING AND INJECT 28 UNITS AT NIGHT   INVOKANA 100 MG Tabs tablet Generic drug:  canagliflozin TAKE 1 TABLET DAILY BEFORE BREAKFAST.   metFORMIN 500 MG  tablet Commonly known as:  GLUCOPHAGE Take 1,000 mg by mouth 2 (two) times daily with a meal.   OVER THE COUNTER MEDICATION   telmisartan 20 MG tablet Commonly known as:  MICARDIS TAKE 1 TABLET EVERY DAY       Allergies: No Known Allergies  Past Medical History:  Diagnosis Date  . Diabetes mellitus, type II ([place])   . HLD (hyperlipidemia)   . HTN (hypertension)   . Obesity     Past Surgical History:  Procedure Laterality Date  . ABDOMINAL SURGERY      Family History  Problem Relation Age of Onset  . Diabetes Mother   . Diabetes Father   . Diabetes Brother     Social History:  reports that  has never smoked. he has never used smokeless tobacco. He reports that he does not drink alcohol or use drugs.   Review of Systems   Lipid history: LDL below 70, he is on Lipitor 10 mg daily    Lab Results  Component Value Date   CHOL 120 09/17/2016   HDL 42.50 09/17/2016   LDLCALC 64 09/17/2016   TRIG 68.0 09/17/2016   CHOLHDL 3 09/17/2016           Hypertension:  Controlled with taking 20 MG telmisartan Followed by PCP  BP Readings from Last 3 Encounters:  07/12/17 124/84  04/11/17 140/86  03/31/17 (!) 147/88     He does have regular eye exams Most recent foot exam:1/19 with his pcp    Physical Examination:  BP 124/84 (BP Location: Left Arm, Patient Position: Sitting, Cuff Size: Large)   Pulse 87   Ht _0  (1.753 m)   Wt 262 lb 9.6 oz (119.1 kg)   SpO2 96%   BMI 38.78 kg/m    ASSESSMENT:  Diabetes type 2, with obesity, insulin requiring  See history of present illness for detailed discussion of current diabetes management, blood sugar patterns and problems identified  His A1c is still fairly good at 6.8  He does have occasional high blood sugars at various times depending on his diet especially late at night or overnight Also has not been exercising as much as before but has been able to maintain his weight  He is only on basal insulin and  may not be able to get good postprandial control consistently depending on his carbohydrate or other intake Blood sugars are averaging about 120 fasting and about 150 after his evening meal which is adequate  He does take his insulin and check his blood sugars as directed  Since there is no consistent pattern of high readings and also No hypoglycemia will not change his twice a day Lantus regimen  HYPERTENSION: He needs to follow-up with his PCP   Lipids: Will have these checked again next time      There are no Patient Instructions on file for this visit.    Elayne Snare 07/12/2017, 9:53 AM   Note: This office note was prepared with Dragon voice recognition system technology. Any transcriptional errors that result from this process are unintentional.

## 2017-07-12 NOTE — Patient Instructions (Addendum)
Check blood sugars on waking up 4/7   Also check blood sugars about 2 hours after a meal and do this after different meals by rotation  Recommended blood sugar levels on waking up is 90-130 and about 2 hours after meal is 130-160  Please bring your blood sugar monitor to each visit, thank you  Please send eye exam report

## 2017-09-12 ENCOUNTER — Other Ambulatory Visit: Payer: Self-pay | Admitting: Endocrinology

## 2017-10-07 ENCOUNTER — Other Ambulatory Visit (INDEPENDENT_AMBULATORY_CARE_PROVIDER_SITE_OTHER): Payer: Medicare HMO

## 2017-10-07 DIAGNOSIS — E1165 Type 2 diabetes mellitus with hyperglycemia: Secondary | ICD-10-CM | POA: Diagnosis not present

## 2017-10-07 DIAGNOSIS — Z794 Long term (current) use of insulin: Secondary | ICD-10-CM | POA: Diagnosis not present

## 2017-10-07 LAB — LIPID PANEL
CHOLESTEROL: 128 mg/dL (ref 0–200)
HDL: 39.4 mg/dL (ref >39.00)
LDL CALC: 75 mg/dL (ref 0–99)
NonHDL: 88.15
TRIGLYCERIDES: 65 mg/dL (ref 0.0–149.0)
Total CHOL/HDL Ratio: 3
VLDL: 13 mg/dL (ref 0.0–40.0)

## 2017-10-07 LAB — COMPREHENSIVE METABOLIC PANEL
ALT: 20 U/L (ref 0–53)
AST: 19 U/L (ref 0–37)
Albumin: 4.1 g/dL (ref 3.5–5.2)
Alkaline Phosphatase: 91 U/L (ref 39–117)
BUN: 15 mg/dL (ref 6–23)
CHLORIDE: 104 meq/L (ref 96–112)
CO2: 29 meq/L (ref 19–32)
Calcium: 9.7 mg/dL (ref 8.4–10.5)
Creatinine, Ser: 1.19 mg/dL (ref 0.40–1.50)
GFR: 79.77 mL/min (ref >60.00)
Glucose, Bld: 116 mg/dL — ABNORMAL HIGH (ref 70–99)
POTASSIUM: 4.9 meq/L (ref 3.5–5.1)
Sodium: 140 mEq/L (ref 135–145)
Total Bilirubin: 0.4 mg/dL (ref 0.2–1.2)
Total Protein: 7.1 g/dL (ref 6.0–8.3)

## 2017-10-07 LAB — MICROALBUMIN / CREATININE URINE RATIO
CREATININE, U: 69.5 mg/dL
Microalb Creat Ratio: 1 mg/g (ref 0.0–30.0)
Microalb, Ur: <0.7 mg/dL (ref 0.0–1.9)

## 2017-10-07 LAB — HEMOGLOBIN A1C: Hgb A1c MFr Bld: 6.9 % — ABNORMAL HIGH (ref 4.6–6.5)

## 2017-10-12 ENCOUNTER — Encounter: Payer: Self-pay | Admitting: Endocrinology

## 2017-10-12 ENCOUNTER — Ambulatory Visit (INDEPENDENT_AMBULATORY_CARE_PROVIDER_SITE_OTHER): Payer: Medicare HMO | Admitting: Endocrinology

## 2017-10-12 VITALS — BP 144/80 | HR 82 | Ht 69.0 in | Wt 258.8 lb

## 2017-10-12 DIAGNOSIS — Z794 Long term (current) use of insulin: Secondary | ICD-10-CM | POA: Diagnosis not present

## 2017-10-12 DIAGNOSIS — E1165 Type 2 diabetes mellitus with hyperglycemia: Secondary | ICD-10-CM | POA: Diagnosis not present

## 2017-10-12 NOTE — Progress Notes (Signed)
Patient ID: Nicholas Schroeder, male   DOB: [birth year], 62 y.o.   MRN: [id number]            Reason for Appointment: Follow-up for Type 2 Diabetes  Referring physician: Suzanna Obey   History of Present Illness:          Date of diagnosis of type 2 diabetes mellitus: ? 2008        Background history:   Patient is unclear about when he was diagnosed to have diabetes and how his diagnosis was made Has been on metformin since diagnosis but not clear if he took other diabetes medications He was however started on insulin when his blood sugars are markedly increased in 2011 He thinks he has been on Lantus insulin all along with minimal change in doses  Recent history:   INSULIN regimen is: Lantus  30 in a.m. and 28 hs p.m.  with syringe       Non-insulin hypoglycemic drugs the patient is taking are: Metformin ER 2000 mg daily, Invokana 100 mg daily  His A1c is similar at 6.9 now, previously range 6.4-6.8  Current management, blood sugar patterns and problems identified:  His blood sugars appear to be on an average somewhat higher than the last time but his A1c has not changed  Not clear why he has fluctuation in his blood sugars at all times  Also has on an average higher readings in the mornings compared to after meals at night  He says that occasionally he may forget his insulin in the evening when he falls asleep at bedtime  However no hypoglycemia  He is not clear what types of foods make his blood sugar go up after eating, however he is occasionally having regular soft drinks instead of diet recently  He is trying to do some walking and is increasing this, going in the mornings recently  With this his weight has come down somewhat    Side effects from medications have been: None  Compliance with the medical regimen: Improved  Glucose monitoring:  done up to 2 times a day         Glucometer:  Accu-Chek .      Blood Glucose readings by time of day and averages from  download  Mean values apply above for all meters except median for One Touch  PRE-MEAL Fasting Lunch Dinner Bedtime Overall  Glucose range:  116-252     88-277  Mean/median:  174     159   POST-MEAL PC Breakfast PC Lunch PC Dinner  Glucose range:    112-277  Mean/median:   130  170     Self-care:   Marland Kitchen     Meal times are:  Lunch: 12pm Dinner: 6-7    Typical meal intake: Breakfast is usually none               Dietician visit, most recent: 07/02/16               Exercise:  walking in ams  Weight history:  Wt Readings from Last 3 Encounters:  10/12/17 258 lb 12.8 oz (117.4 kg)  07/12/17 262 lb 9.6 oz (119.1 kg)  04/11/17 261 lb 9.6 oz (118.7 kg)    Glycemic control:   Lab Results  Component Value Date   HGBA1C 6.9 (H) 10/07/2017   HGBA1C 6.8 (H) 07/07/2017   HGBA1C 6.8 (H) 04/05/2017   Lab Results  Component Value Date   MICROALBUR <0.7 10/07/2017   [place]  75 10/07/2017   CREATININE 1.19 10/07/2017   Lab Results  Component Value Date   MICRALBCREAT 1.0 10/07/2017      Lab Results  Component Value Date   FRUCTOSAMINE 227 01/04/2017   FRUCTOSAMINE 238 11/02/2016        Allergies as of 10/12/2017   No Known Allergies     Medication List        Accurate as of 10/12/17 12:59 PM. Always use your most recent med list.          aspirin 81 MG tablet Take 81 mg by mouth daily.   blood glucose meter kit and supplies Kit by Does not apply route. As directed   insulin glargine 100 UNIT/ML injection Commonly known as:  LANTUS INJECT 30 UNITS IN THE MORNING AND INJECT 28 UNITS AT NIGHT   INVOKANA 100 MG Tabs tablet Generic drug:  canagliflozin TAKE 1 TABLET DAILY BEFORE BREAKFAST.   metFORMIN 500 MG tablet Commonly known as:  GLUCOPHAGE Take 1,000 mg by mouth 2 (two) times daily with a meal.   OVER THE COUNTER MEDICATION   telmisartan 20 MG tablet Commonly known as:  MICARDIS TAKE 1 TABLET EVERY DAY       Allergies: No Known  Allergies  Past Medical History:  Diagnosis Date  . Diabetes mellitus, type II ([place])   . HLD (hyperlipidemia)   . HTN (hypertension)   . Obesity     Past Surgical History:  Procedure Laterality Date  . ABDOMINAL SURGERY      Family History  Problem Relation Age of Onset  . Diabetes Mother   . Diabetes Father   . Diabetes Brother     Social History:  reports that he has never smoked. He has never used smokeless tobacco. He reports that he does not drink alcohol or use drugs.   Review of Systems   Lipid history: LDL below 70, he is on Lipitor 10 mg daily    Lab Results  Component Value Date   CHOL 128 10/07/2017   HDL 39.40 10/07/2017   LDLCALC 75 10/07/2017   TRIG 65.0 10/07/2017   CHOLHDL 3 10/07/2017           Hypertension:  Controlled with taking 20 MG telmisartan Followed by PCP  BP Readings from Last 3 Encounters:  10/12/17 (!) 144/80  07/12/17 124/84  04/11/17 140/86     He does have regular eye exams but is overdue now  Most recent foot exam:1/19 with his pcp    Physical Examination:  BP (!) 144/80 (BP Location: Left Arm, Patient Position: Sitting, Cuff Size: Normal)   Pulse 82   Ht 5' 9"  (1.753 m)   Wt 258 lb 12.8 oz (117.4 kg)   SpO2 98%   BMI 38.22 kg/m    ASSESSMENT:  Diabetes type 2, with obesity, insulin requiring  See history of present illness for detailed discussion of current diabetes management, blood sugar patterns and problems identified  His A1c is still fairly good at 6.9  He has fluctuation in his blood sugars but not consistently Some of his high readings are related to regular soft drinks and variable diet He is trying to do a little walking recently and fasting readings are starting to improve recently Although he may benefit from higher doses of Invokana his potassium is high normal and will wait until next visit  HYPERTENSION: Controlled fairly well, can follow-up with PCP Microalbumin normal  He needs to  follow-up with his PCP for other  problems  Lipids: Well controlled with LDL 75     Patient Instructions  Get eye exam  Take pm Lantus at supper  No regular sodas       Elayne Snare 10/12/2017, 12:59 PM   Note: This office note was prepared with Dragon voice recognition system technology. Any transcriptional errors that result from this process are unintentional.

## 2017-10-12 NOTE — Patient Instructions (Addendum)
Get eye exam  Take pm Lantus at supper  No regular sodas

## 2017-10-24 ENCOUNTER — Other Ambulatory Visit: Payer: Self-pay | Admitting: Endocrinology

## 2017-11-28 ENCOUNTER — Other Ambulatory Visit: Payer: Self-pay | Admitting: Endocrinology

## 2017-12-28 LAB — HM DIABETES EYE EXAM

## 2018-01-09 ENCOUNTER — Other Ambulatory Visit (INDEPENDENT_AMBULATORY_CARE_PROVIDER_SITE_OTHER): Payer: Medicare HMO

## 2018-01-09 DIAGNOSIS — Z794 Long term (current) use of insulin: Secondary | ICD-10-CM

## 2018-01-09 DIAGNOSIS — E1165 Type 2 diabetes mellitus with hyperglycemia: Secondary | ICD-10-CM

## 2018-01-09 LAB — BASIC METABOLIC PANEL
BUN: 16 mg/dL (ref 6–23)
CHLORIDE: 105 meq/L (ref 96–112)
CO2: 30 meq/L (ref 19–32)
CREATININE: 1.18 mg/dL (ref 0.40–1.50)
Calcium: 9.8 mg/dL (ref 8.4–10.5)
GFR: 80.48 mL/min (ref >60.00)
Glucose, Bld: 102 mg/dL — ABNORMAL HIGH (ref 70–99)
POTASSIUM: 5 meq/L (ref 3.5–5.1)
Sodium: 140 mEq/L (ref 135–145)

## 2018-01-09 LAB — HEMOGLOBIN A1C: HEMOGLOBIN A1C: 7 % — AB (ref 4.6–6.5)

## 2018-01-12 ENCOUNTER — Ambulatory Visit (INDEPENDENT_AMBULATORY_CARE_PROVIDER_SITE_OTHER): Payer: Medicare HMO | Admitting: Endocrinology

## 2018-01-12 ENCOUNTER — Encounter: Payer: Self-pay | Admitting: Endocrinology

## 2018-01-12 VITALS — BP 130/90 | HR 86 | Ht 69.0 in | Wt 262.0 lb

## 2018-01-12 DIAGNOSIS — Z794 Long term (current) use of insulin: Secondary | ICD-10-CM

## 2018-01-12 DIAGNOSIS — E1165 Type 2 diabetes mellitus with hyperglycemia: Secondary | ICD-10-CM

## 2018-01-12 MED ORDER — HYDROCHLOROTHIAZIDE 12.5 MG PO CAPS: 12.5000 mg | ORAL_CAPSULE | Freq: Every day | ORAL | 1 refills | Status: DC

## 2018-01-12 NOTE — Patient Instructions (Signed)
Low fat snacks at [employer]

## 2018-01-12 NOTE — Progress Notes (Signed)
Patient ID: Nicholas Schroeder, male   DOB: 1955-12-31, 62 y.o.   MRN: [id number]            Reason for Appointment: Follow-up for Type 2 Diabetes  Referring physician: Suzanna Obey   History of Present Illness:          Date of diagnosis of type 2 diabetes mellitus: ? 2008        Background history:   Patient is unclear about when he was diagnosed to have diabetes and how his diagnosis was made Has been on metformin since diagnosis but not clear if he took other diabetes medications He was however started on insulin when his blood sugars are markedly increased in 2011 He thinks he has been on Lantus insulin all along with minimal change in doses  Recent history:   INSULIN regimen is: Lantus  30 in a.m. and 28 hs p.m.  with syringe       Non-insulin hypoglycemic drugs the patient is taking are: Metformin ER 2000 mg daily, Invokana 100 mg daily  His A1c is fairly stable at 7%, previously range 6.4-6.8  Current management, blood sugar patterns and problems identified:  He has checked his blood sugars fairly regularly at different times including after supper but only about once or twice a day on an average  He does have several high readings in the mornings periodically as high as 234 which he thinks is from eating snacks like cookies or ice cream late at night  However blood sugars are frequently not high after meals during the day  He has tried to do a little walking but not consistent and still not able to lose weight  Blood sugars after meals are being checked in the afternoon and evening, usually not eating breakfast  He does try to avoid regular soft drinks  Side effects from medications have been: None  Compliance with the medical regimen: Improved  Glucose monitoring:  done up to 2 times a day         Glucometer:  Accu-Chek .      Blood Glucose readings by time of day and averages from download   PRE-MEAL Fasting Lunch Dinner Bedtime Overall  Glucose range:   90-234      Mean/median:  148     152+/-41   POST-MEAL PC Breakfast PC Lunch PC Dinner  Glucose range:   102-212  110-201  Mean/median:   146  156    Previous readings: PRE-MEAL Fasting Lunch Dinner Bedtime Overall  Glucose range:  116-252     88-277  Mean/median:  174     159   POST-MEAL PC Breakfast PC Lunch PC Dinner  Glucose range:    112-277  Mean/median:   130  170     Self-care:   Marland Kitchen     Meal times are:  Lunch: 12pm Dinner: 6-7    Typical meal intake: Breakfast is usually none               Dietician visit, most recent: 07/02/16               Exercise:  walking periodically in ams  Weight history:  Wt Readings from Last 3 Encounters:  01/12/18 262 lb (118.8 kg)  10/12/17 258 lb 12.8 oz (117.4 kg)  07/12/17 262 lb 9.6 oz (119.1 kg)    Glycemic control:   Lab Results  Component Value Date   HGBA1C 7.0 (H) 01/09/2018   HGBA1C 6.9 (H) 10/07/2017  HGBA1C 6.8 (H) 07/07/2017   Lab Results  Component Value Date   MICROALBUR <0.7 10/07/2017   LDLCALC 75 10/07/2017   CREATININE 1.18 01/09/2018   Lab Results  Component Value Date   MICRALBCREAT 1.0 10/07/2017     Lab Results  Component Value Date   FRUCTOSAMINE 227 01/04/2017   FRUCTOSAMINE 238 11/02/2016        Allergies as of 01/12/2018   No Known Allergies     Medication List        Accurate as of 01/12/18  9:49 AM. Always use your most recent med list.          aspirin 81 MG tablet Take 81 mg by mouth daily.   blood glucose meter kit and supplies Kit by Does not apply route. As directed   hydrochlorothiazide 12.5 MG capsule Commonly known as:  MICROZIDE Take 1 capsule (12.5 mg total) by mouth daily.   insulin glargine 100 UNIT/ML injection Commonly known as:  LANTUS INJECT 30 UNITS SUBCUTANEOUSLY IN THE MORNING AND INJECT 28 UNITS AT NIGHT   INVOKANA 100 MG Tabs tablet Generic drug:  canagliflozin TAKE 1 TABLET DAILY BEFORE BREAKFAST.   metFORMIN 500 MG tablet Commonly  known as:  GLUCOPHAGE Take 1,000 mg by mouth 2 (two) times daily with a meal.   OVER THE COUNTER MEDICATION   telmisartan 20 MG tablet Commonly known as:  MICARDIS TAKE 1 TABLET EVERY DAY       Allergies: No Known Allergies  Past Medical History:  Diagnosis Date  . Diabetes mellitus, type II ([place])   . HLD (hyperlipidemia)   . HTN (hypertension)   . Obesity     Past Surgical History:  Procedure Laterality Date  . ABDOMINAL SURGERY      Family History  Problem Relation Age of Onset  . Diabetes Mother   . Diabetes Father   . Diabetes Brother     Social History:  reports that he has never smoked. He has never used smokeless tobacco. He reports that he does not drink alcohol or use drugs.   Review of Systems   Lipid history: LDL below 70, he is on Lipitor 10 mg daily    Lab Results  Component Value Date   CHOL 128 10/07/2017   HDL 39.40 10/07/2017   LDLCALC 75 10/07/2017   TRIG 65.0 10/07/2017   CHOLHDL 3 10/07/2017           Hypertension:  Treated with taking 20 MG telmisartan by PCP   BP Readings from Last 3 Encounters:  01/12/18 130/90  10/12/17 (!) 144/80  07/12/17 124/84   He tends to have high normal potassium levels  Lab Results  Component Value Date   K 5.0 01/09/2018     He does have regular eye exams, last report not available as yet  Most recent foot exam:1/19 with his pcp    Physical Examination:  BP 130/90   Pulse 86   Ht 5' 9"  (1.753 m)   Wt 262 lb (118.8 kg)   SpO2 97%   BMI 38.69 kg/m    ASSESSMENT:  Diabetes type 2, with obesity, insulin requiring  See history of present illness for detailed discussion of current diabetes management, blood sugar patterns and problems identified  His A1c is still fairly good at 7%, previously 6.9  Currently on regimen of basal insulin, metformin and Invokana His blood sugars are averaging about 150 at home Does have periodic high readings after meals but probably highest  readings are after his late night snacks with cookies or ice cream He is trying to do a little walking but not enough and has not lost any weight this time  Although he may benefit from higher doses of Invokana his potassium is high normal Given list of low fat low carbohydrate snacks at night   HYPERTENSION: Blood pressure appears to be persistently high, especially diastolic even though he thinks his blood pressure is better at home  Since he has a relatively high blood pressure and high normal potassium will start HCTZ 12.5 mg daily To follow-up with PCP  [place]flu[place] vaccine given   Patient Instructions  Low fat snacks at [place] 01/12/2018, 9:49 AM   Note: This office note was prepared with Dragon voice recognition system technology. Any transcriptional errors that result from this process are unintentional.

## 2018-01-31 ENCOUNTER — Other Ambulatory Visit: Payer: Self-pay | Admitting: Endocrinology

## 2018-03-13 ENCOUNTER — Other Ambulatory Visit: Payer: Self-pay | Admitting: Endocrinology

## 2018-03-27 ENCOUNTER — Encounter: Payer: Self-pay | Admitting: *Deleted

## 2018-03-28 ENCOUNTER — Telehealth: Payer: Self-pay | Admitting: Endocrinology

## 2018-03-28 ENCOUNTER — Other Ambulatory Visit: Payer: Self-pay

## 2018-03-28 NOTE — Telephone Encounter (Signed)
Pt came by to inquire about a medication refill for his Metformin and his 33 g syringes through [facility]. Pt stated that the insurance has sent the fax with no response and Dr. Dwyane Dee has never prescribed these meds for the pt.

## 2018-03-29 ENCOUNTER — Other Ambulatory Visit: Payer: Self-pay

## 2018-03-31 NOTE — Telephone Encounter (Signed)
Please advise 

## 2018-04-02 NOTE — Telephone Encounter (Signed)
Please renew current prescriptions

## 2018-04-03 ENCOUNTER — Other Ambulatory Visit: Payer: Self-pay | Admitting: Endocrinology

## 2018-04-03 MED ORDER — METFORMIN HCL 500 MG PO TABS: 1000.0000 mg | ORAL_TABLET | Freq: Two times a day (BID) | ORAL | 1 refills | Status: DC

## 2018-04-04 ENCOUNTER — Other Ambulatory Visit: Payer: Self-pay | Admitting: Endocrinology

## 2018-04-04 NOTE — Telephone Encounter (Signed)
Sent Rx to the pharmacy 

## 2018-04-18 ENCOUNTER — Other Ambulatory Visit (INDEPENDENT_AMBULATORY_CARE_PROVIDER_SITE_OTHER): Payer: Medicare HMO

## 2018-04-18 DIAGNOSIS — E1165 Type 2 diabetes mellitus with hyperglycemia: Secondary | ICD-10-CM | POA: Diagnosis not present

## 2018-04-18 DIAGNOSIS — Z794 Long term (current) use of insulin: Secondary | ICD-10-CM

## 2018-04-18 LAB — BASIC METABOLIC PANEL
BUN: 17 mg/dL (ref 6–23)
CALCIUM: 9.7 mg/dL (ref 8.4–10.5)
CO2: 30 mEq/L (ref 19–32)
Chloride: 103 mEq/L (ref 96–112)
Creatinine, Ser: 1.34 mg/dL (ref 0.40–1.50)
GFR: 69.44 mL/min (ref >60.00)
GLUCOSE: 142 mg/dL — AB (ref 70–99)
Potassium: 4.9 mEq/L (ref 3.5–5.1)
Sodium: 139 mEq/L (ref 135–145)

## 2018-04-18 LAB — HEMOGLOBIN A1C: Hgb A1c MFr Bld: 7.1 % — ABNORMAL HIGH (ref 4.6–6.5)

## 2018-04-19 NOTE — Progress Notes (Signed)
Patient ID: Nicholas Schroeder, male   DOB: Jan 25, 1956, 62 y.o.   MRN: [id number]            Reason for Appointment: Follow-up for Type 2 Diabetes  Referring physician: Suzanna Obey   History of Present Illness:          Date of diagnosis of type 2 diabetes mellitus: ? 2008        Background history:   Patient is unclear about when he was diagnosed to have diabetes and how his diagnosis was made Has been on metformin since diagnosis but not clear if he took other diabetes medications He was however started on insulin when his blood sugars are markedly increased in 2011 He thinks he has been on Lantus insulin all along with minimal change in doses  Recent history:   INSULIN regimen is: Lantus  30 in a.m. and 28 hs p.m.  with syringe       Non-insulin hypoglycemic drugs the patient is taking are: Metformin ER 2000 mg daily, Invokana 100 mg daily  His A1c is fairly stable at 7%, previously range 6.4-6.8  Current management, blood sugar patterns and problems identified:  His blood sugars are about the same overall as on the last visit  He has some variability based on his diet  Recently tending to have somewhat high readings after some of his meals but not consistent and based on his carbohydrate intake  Occasionally will have regular soft drinks which can cause high readings  Also again he thinks his higher morning readings are when he has a snack at night  Has not been walking as much in wintertime and his weight has gone up 5 pounds  Has been consistent with using Lantus, currently using a syringe  Side effects from medications have been: None  Compliance with the medical regimen: Improved  Glucose monitoring:  done up to 2 times a day         Glucometer:  Accu-Chek .      Blood Glucose readings by time of day and averages from download   PRE-MEAL Fasting Lunch Dinner Bedtime Overall  Glucose range:  90-176      Mean/median:  133  145    146+/-48   POST-MEAL PC  Breakfast PC Lunch PC Dinner  Glucose range:   74-332  93-219  Mean/median:   160  150   Previous reading  PRE-MEAL Fasting Lunch Dinner Bedtime Overall  Glucose range:  90-234      Mean/median:  148     152+/-41   POST-MEAL PC Breakfast PC Lunch PC Dinner  Glucose range:   102-212  110-201  Mean/median:   146  156    Self-care:   Marland Kitchen     Meal times are:  Lunch: 12pm Dinner: 6-7    Typical meal intake: Breakfast is usually none               Dietician visit, most recent: 07/02/16               Exercise:  walking 3/7 in ams  Weight history:  Wt Readings from Last 3 Encounters:  04/20/18 267 lb 9.6 oz (121.4 kg)  01/12/18 262 lb (118.8 kg)  10/12/17 258 lb 12.8 oz (117.4 kg)    Glycemic control:   Lab Results  Component Value Date   HGBA1C 7.1 (H) 04/18/2018   HGBA1C 7.0 (H) 01/09/2018   HGBA1C 6.9 (H) 10/07/2017   Lab Results  Component Value  Date   MICROALBUR <0.7 10/07/2017   LDLCALC 75 10/07/2017   CREATININE 1.34 04/18/2018   Lab Results  Component Value Date   MICRALBCREAT 1.0 10/07/2017     Lab Results  Component Value Date   FRUCTOSAMINE 227 01/04/2017   FRUCTOSAMINE 238 11/02/2016        Allergies as of 04/20/2018   No Known Allergies     Medication List        Accurate as of 04/20/18 11:04 AM. Always use your most recent med list.          aspirin 81 MG tablet Take 81 mg by mouth daily.   blood glucose meter kit and supplies Kit by Does not apply route. As directed   hydrochlorothiazide 12.5 MG capsule Commonly known as:  MICROZIDE Take 1 capsule (12.5 mg total) by mouth daily.   insulin glargine 100 UNIT/ML injection Commonly known as:  LANTUS INJECT 30 UNITS SUBCUTANEOUSLY IN THE MORNING AND INJECT 28 UNITS AT NIGHT   INVOKANA 100 MG Tabs tablet Generic drug:  canagliflozin TAKE 1 TABLET DAILY BEFORE BREAKFAST.   metFORMIN 500 MG tablet Commonly known as:  GLUCOPHAGE Take 2 tablets (1,000 mg total) by mouth 2 (two)  times daily with a meal.   OVER THE COUNTER MEDICATION   telmisartan 20 MG tablet Commonly known as:  MICARDIS TAKE 1 TABLET EVERY DAY       Allergies: No Known Allergies  Past Medical History:  Diagnosis Date  . Diabetes mellitus, type II ([place])   . HLD (hyperlipidemia)   . HTN (hypertension)   . Obesity     Past Surgical History:  Procedure Laterality Date  . ABDOMINAL SURGERY      Family History  Problem Relation Age of Onset  . Diabetes Mother   . Diabetes Father   . Diabetes Brother     Social History:  reports that he has never smoked. He has never used smokeless tobacco. He reports that he does not drink alcohol or use drugs.   Review of Systems   Lipid history: LDL below 70, he is on Lipitor 10 mg daily    Lab Results  Component Value Date   CHOL 128 10/07/2017   HDL 39.40 10/07/2017   LDLCALC 75 10/07/2017   TRIG 65.0 10/07/2017   CHOLHDL 3 10/07/2017           Hypertension:  Treated with taking 20 MG telmisartan by PCP and recently appears to have better control with adding HCTZ on his last visit   BP Readings from Last 3 Encounters:  04/20/18 124/80  01/12/18 130/90  10/12/17 (!) 144/80   Potassium may be high normal at times  Lab Results  Component Value Date   K 4.9 04/18/2018     He does have regular eye exams, no retinopathy  Most recent foot exam:1/19 with his pcp    Physical Examination:  BP 124/80 (BP Location: Left Arm, Patient Position: Sitting, Cuff Size: Normal)   Pulse 80   Ht 5' 9"  (1.753 m)   Wt 267 lb 9.6 oz (121.4 kg)   SpO2 99%   BMI 39.52 kg/m     ASSESSMENT:  Diabetes type 2, with obesity, insulin requiring  See history of present illness for detailed discussion of current diabetes management, blood sugar patterns and problems identified  His A1c is still fairly good and stable around 7%  Currently on regimen of basal insulin, metformin and Invokana 100 mg  He will have some  high readings after  meals but only if he is going off his diet and generally does not appear to have any consistent high readings after lunch or dinner He has gained weight from inconsistent diet and exercise more recently however  Currently using Lantus twice daily and not clear if he has better coverage for [place] However he can switch to the pen instead of the syringe  Although he may benefit from higher doses of Invokana his creatinine is high normal now Discussed watching his diet consistently and avoiding regular soft drinks He will try to be as active as possible   HYPERTENSION: Blood pressure is relatively better with HCTZ  Continue to monitor renal function     There are no Patient Instructions on file for this visit.    Elayne Snare 04/20/2018, 11:04 AM   Note: This office note was prepared with Dragon voice recognition system technology. Any transcriptional errors that result from this process are unintentional.

## 2018-04-20 ENCOUNTER — Encounter: Payer: Self-pay | Admitting: Endocrinology

## 2018-04-20 ENCOUNTER — Ambulatory Visit: Payer: Medicare HMO | Admitting: Endocrinology

## 2018-04-20 VITALS — BP 124/80 | HR 80 | Ht 69.0 in | Wt 267.6 lb

## 2018-04-20 DIAGNOSIS — Z794 Long term (current) use of insulin: Secondary | ICD-10-CM

## 2018-04-20 DIAGNOSIS — E1165 Type 2 diabetes mellitus with hyperglycemia: Secondary | ICD-10-CM | POA: Diagnosis not present

## 2018-04-20 MED ORDER — INSULIN GLARGINE 100 UNIT/ML SOLOSTAR PEN: PEN_INJECTOR | SUBCUTANEOUS | 1 refills | Status: DC

## 2018-04-20 MED ORDER — INSULIN PEN NEEDLE 31G X 5 MM MISC: 1 refills | Status: DC

## 2018-04-20 NOTE — Patient Instructions (Signed)
Walk daily  Avoid sweet drinks  Check blood sugars on waking up 5 days a week  Also check blood sugars about 2 hours after meals and do this after different meals by rotation  Recommended blood sugar levels on waking up are 90-130 and about 2 hours after meal is 130-160  Please bring your blood sugar monitor to each visit, thank you

## 2018-05-31 ENCOUNTER — Other Ambulatory Visit: Payer: Self-pay | Admitting: Endocrinology

## 2018-06-07 ENCOUNTER — Other Ambulatory Visit: Payer: Self-pay | Admitting: Endocrinology

## 2018-06-21 ENCOUNTER — Other Ambulatory Visit: Payer: Self-pay | Admitting: Endocrinology

## 2018-07-10 ENCOUNTER — Other Ambulatory Visit: Payer: Self-pay | Admitting: Endocrinology

## 2018-08-24 ENCOUNTER — Other Ambulatory Visit: Payer: Self-pay

## 2018-08-24 ENCOUNTER — Other Ambulatory Visit (INDEPENDENT_AMBULATORY_CARE_PROVIDER_SITE_OTHER): Payer: Medicare HMO

## 2018-08-24 ENCOUNTER — Telehealth: Payer: Self-pay | Admitting: Endocrinology

## 2018-08-24 DIAGNOSIS — E1165 Type 2 diabetes mellitus with hyperglycemia: Secondary | ICD-10-CM

## 2018-08-24 DIAGNOSIS — Z794 Long term (current) use of insulin: Secondary | ICD-10-CM | POA: Diagnosis not present

## 2018-08-24 LAB — MICROALBUMIN / CREATININE URINE RATIO
Creatinine,U: 105.9 mg/dL
Microalb Creat Ratio: 0.7 mg/g (ref 0.0–30.0)
Microalb, Ur: <0.7 mg/dL (ref 0.0–1.9)

## 2018-08-24 LAB — COMPREHENSIVE METABOLIC PANEL
ALT: 15 U/L (ref 0–53)
AST: 14 U/L (ref 0–37)
Albumin: 4.2 g/dL (ref 3.5–5.2)
Alkaline Phosphatase: 81 U/L (ref 39–117)
BUN: 18 mg/dL (ref 6–23)
CO2: 28 mEq/L (ref 19–32)
Calcium: 9.8 mg/dL (ref 8.4–10.5)
Chloride: 101 mEq/L (ref 96–112)
Creatinine, Ser: 1.25 mg/dL (ref 0.40–1.50)
GFR: 70.71 mL/min (ref >60.00)
Glucose, Bld: 79 mg/dL (ref 70–99)
Potassium: 4.7 mEq/L (ref 3.5–5.1)
Sodium: 139 mEq/L (ref 135–145)
Total Bilirubin: 0.4 mg/dL (ref 0.2–1.2)
Total Protein: 7.2 g/dL (ref 6.0–8.3)

## 2018-08-24 LAB — LIPID PANEL
Cholesterol: 128 mg/dL (ref 0–200)
HDL: 41 mg/dL (ref >39.00)
LDL Cholesterol: 73 mg/dL (ref 0–99)
NonHDL: 86.7
Total CHOL/HDL Ratio: 3
Triglycerides: 71 mg/dL (ref 0.0–149.0)
VLDL: 14.2 mg/dL (ref 0.0–40.0)

## 2018-08-24 LAB — HEMOGLOBIN A1C: Hgb A1c MFr Bld: 7.4 % — ABNORMAL HIGH (ref 4.6–6.5)

## 2018-08-24 MED ORDER — GLUCOSE BLOOD VI STRP: ORAL_STRIP | 1 refills | Status: DC

## 2018-08-24 NOTE — Telephone Encounter (Signed)
MEDICATION: Test Strips  PHARMACY:  [facility]  IS THIS A 90 DAY SUPPLY :  yes  IS PATIENT OUT OF MEDICATION: yes  IF NOT; HOW MUCH IS LEFT:   LAST APPOINTMENT DATE: @2 /24/2020  NEXT APPOINTMENT DATE:@4 /01/2019  DO WE HAVE YOUR PERMISSION TO LEAVE A DETAILED MESSAGE: yes  OTHER COMMENTS:    **Let patient know to contact pharmacy at the end of the day to make sure medication is ready. **  ** Please notify patient to allow 48-72 hours to process**  **Encourage patient to contact the pharmacy for refills or they can request refills through [facility]**

## 2018-08-24 NOTE — Telephone Encounter (Signed)
rx sent

## 2018-08-28 ENCOUNTER — Ambulatory Visit (INDEPENDENT_AMBULATORY_CARE_PROVIDER_SITE_OTHER): Payer: Medicare HMO | Admitting: Endocrinology

## 2018-08-28 ENCOUNTER — Encounter: Payer: Self-pay | Admitting: Endocrinology

## 2018-08-28 ENCOUNTER — Telehealth: Payer: Self-pay | Admitting: Endocrinology

## 2018-08-28 ENCOUNTER — Other Ambulatory Visit: Payer: Self-pay

## 2018-08-28 DIAGNOSIS — Z794 Long term (current) use of insulin: Secondary | ICD-10-CM | POA: Diagnosis not present

## 2018-08-28 DIAGNOSIS — E1165 Type 2 diabetes mellitus with hyperglycemia: Secondary | ICD-10-CM | POA: Diagnosis not present

## 2018-08-28 NOTE — Telephone Encounter (Signed)
Please clarify if he is taking atorvastatin for his cholesterol

## 2018-08-28 NOTE — Telephone Encounter (Signed)
Called and left detailed voicemail for pt to return call.

## 2018-08-28 NOTE — Progress Notes (Signed)
Patient ID: Nicholas Schroeder, male   DOB: 05/17/1956, 63 y.o.   MRN: [id number]            Reason for Appointment: Follow-up for Type 2 Diabetes  Referring physician: Suzanna Obey   Today's office visit was provided via telemedicine using phone conversation . Consent for the patient has been obtained . Location of the patient: Home . Location of the provider: Office Only the patient and myself were participating in the encounter    History of Present Illness:          Date of diagnosis of type 2 diabetes mellitus: ? 2008        Background history:   Patient is unclear about when he was diagnosed to have diabetes and how his diagnosis was made Has been on metformin since diagnosis but not clear if he took other diabetes medications He was however started on insulin when his blood sugars are markedly increased in 2011 He thinks he has been on Lantus insulin all along with minimal change in doses  Recent history:   INSULIN regimen is: Lantus  30 in a.m. and 28 hs p.m.  with syringe       Non-insulin hypoglycemic drugs the patient is taking are: Metformin ER 2000 mg daily, Invokana 100 mg daily  His A1c is relatively higher at 7.4, previously stable at 7%, lowest A1c has been 6.4  Current management, blood sugar patterns and problems identified:  His blood sugars are difficult to assess since he has not checked his blood sugars much at all compared to last visit and only has 5 recent readings, only started checking blood sugar on Saturday this month  He has had some high readings fasting last month but has only 2 readings to review otherwise his fasting was 119 on Saturday  Although he thinks that he is only rarely having regular soft drinks not clear why his blood sugars are fluctuating  He usually skips breakfast  More recently also with warmer weather he has been trying to walk every other day or so  By history his weight has gone down 12 pounds, this was checked at home   Side effects from medications have been: None  Compliance with the medical regimen: Improved  Glucose monitoring:  done up to 2 times a day         Glucometer:  Accu-Chek .       Blood Glucose readings by time of day patient reading of his readings:  He has only 5 readings in the last month Morning range 119-170 Nonfasting 113, 133  PREVIOUS readings:  PRE-MEAL Fasting Lunch Dinner Bedtime Overall  Glucose range:  90-176      Mean/median:  133  145    146+/-48   POST-MEAL PC Breakfast PC Lunch PC Dinner  Glucose range:   74-332  93-219  Mean/median:   160  150    Self-care:   Marland Kitchen     Meal times are:  Lunch: 12pm Dinner: 6-7                 Dietician visit, most recent: 07/02/16               Exercise:  walking 3/7  Weight history:  Wt Readings from Last 3 Encounters:  04/20/18 267 lb 9.6 oz (121.4 kg)  01/12/18 262 lb (118.8 kg)  10/12/17 258 lb 12.8 oz (117.4 kg)    Glycemic control:   Lab Results  Component Value Date  HGBA1C 7.4 (H) 08/24/2018   HGBA1C 7.1 (H) 04/18/2018   HGBA1C 7.0 (H) 01/09/2018   Lab Results  Component Value Date   MICROALBUR <0.7 08/24/2018   LDLCALC 73 08/24/2018   CREATININE 1.25 08/24/2018   Lab Results  Component Value Date   MICRALBCREAT 0.7 08/24/2018     Lab Results  Component Value Date   FRUCTOSAMINE 227 01/04/2017   FRUCTOSAMINE 238 11/02/2016        Allergies as of 08/28/2018   No Known Allergies     Medication List       Accurate as of August 28, 2018 10:51 AM. Always use your most recent med list.        aspirin 81 MG tablet Take 81 mg by mouth daily.   blood glucose meter kit and supplies Kit by Does not apply route. As directed   glucose blood test strip Use accu chek smartview strips as instructed to check blood sugar three times daily.   hydrochlorothiazide 12.5 MG capsule Commonly known as:  MICROZIDE TAKE 1 CAPSULE (12.5 MG TOTAL) BY MOUTH DAILY.   Insulin Glargine 100 UNIT/ML  Solostar Pen Commonly known as:  Lantus SoloStar 30 units in the morning and 28 in p.m.   Insulin Pen Needle 31G X 5 MM Misc Use with insulin pen   Invokana 100 MG Tabs tablet Generic drug:  canagliflozin TAKE 1 TABLET DAILY BEFORE BREAKFAST.   metFORMIN 500 MG tablet Commonly known as:  GLUCOPHAGE TAKE 2 TABLETS (1,000 MG TOTAL) BY MOUTH 2 (TWO) TIMES DAILY WITH A MEAL.   OVER THE COUNTER MEDICATION   telmisartan 20 MG tablet Commonly known as:  MICARDIS TAKE 1 TABLET EVERY DAY       Allergies: No Known Allergies  Past Medical History:  Diagnosis Date  . Diabetes mellitus, type II ([place])   . HLD (hyperlipidemia)   . HTN (hypertension)   . Obesity     Past Surgical History:  Procedure Laterality Date  . ABDOMINAL SURGERY      Family History  Problem Relation Age of Onset  . Diabetes Mother   . Diabetes Father   . Diabetes Brother     Social History:  reports that he has never smoked. He has never used smokeless tobacco. He reports that he does not drink alcohol or use drugs.   Review of Systems   Lipid history: LDL below 70, he has been on Lipitor 10 mg daily, not clear if he is taking this    Lab Results  Component Value Date   CHOL 128 08/24/2018   HDL 41.00 08/24/2018   LDLCALC 73 08/24/2018   TRIG 71.0 08/24/2018   CHOLHDL 3 08/24/2018           Hypertension:  Treated with taking 20 MG telmisartan by PCP along with HCTZ He checks blood pressures at home and the last reading was 110/75   BP Readings from Last 3 Encounters:  04/20/18 124/80  01/12/18 130/90  10/12/17 (!) 144/80   Has had high potassium in the past  Lab Results  Component Value Date   K 4.7 08/24/2018    He does have regular eye exams, no retinopathy  Most recent foot exam:1/19 with his pcp    Physical Examination:  There were no vitals taken for this visit.   ASSESSMENT:  Diabetes type 2, with obesity, insulin requiring  See history of present illness  for detailed discussion of current diabetes management, blood sugar patterns and problems identified  His A1c is to go up and is now 7.4, has been as low as 6.4 in the past  Currently on regimen of twice a day Lantus insulin, metformin and Invokana 100 mg  He is likely having some postprandial hyperglycemia and higher A1c likely to be from inconsistent diet Discussed needing to check readings after meals especially after dinner to help him improve his diet He will call if he is getting blood sugars over 180 consistently after meals, may consider mealtime insulin at suppertime Again encouraged him to start checking blood sugars more regularly Discussed blood sugar targets, regular exercise, balanced meals with moderation of carbohydrate intake Discussed possibility of mealtime insulin  Consider increasing Invokana if A1c stays high  Follow-up in 3 months Needs foot exam at next visit  HYPERTENSION: Blood pressure is reportedly good at home  No microalbuminuria currently  Lipids: LDL is 73 and will need to update his prescription for Lipitor, need to clarify that he is taking this   There are no Patient Instructions on file for this visit.  Total evaluation and management time =12 minutes  Elayne Snare 08/28/2018, 10:51 AM   Note: This office note was prepared with Dragon voice recognition system technology. Any transcriptional errors that result from this process are unintentional.

## 2018-09-04 ENCOUNTER — Other Ambulatory Visit: Payer: Self-pay | Admitting: Endocrinology

## 2018-10-02 ENCOUNTER — Telehealth: Payer: Self-pay | Admitting: Endocrinology

## 2018-10-02 NOTE — Telephone Encounter (Signed)
Please call to find out if he is taking atorvastatin and what dose, need to add to the chart

## 2018-10-04 NOTE — Telephone Encounter (Signed)
Spoke with patient - pt is taking Atorvastatin 10mg  QD Med list updated. Nothing further needed.

## 2018-10-09 ENCOUNTER — Other Ambulatory Visit: Payer: Self-pay | Admitting: Endocrinology

## 2018-10-13 ENCOUNTER — Other Ambulatory Visit: Payer: Self-pay | Admitting: Endocrinology

## 2018-10-27 ENCOUNTER — Other Ambulatory Visit: Payer: Self-pay | Admitting: Endocrinology

## 2018-11-27 ENCOUNTER — Other Ambulatory Visit: Payer: Self-pay

## 2018-11-27 ENCOUNTER — Other Ambulatory Visit (INDEPENDENT_AMBULATORY_CARE_PROVIDER_SITE_OTHER): Payer: Medicare HMO

## 2018-11-27 DIAGNOSIS — Z794 Long term (current) use of insulin: Secondary | ICD-10-CM

## 2018-11-27 DIAGNOSIS — E1165 Type 2 diabetes mellitus with hyperglycemia: Secondary | ICD-10-CM

## 2018-11-27 LAB — BASIC METABOLIC PANEL
BUN: 18 mg/dL (ref 6–23)
CO2: 26 mEq/L (ref 19–32)
Calcium: 9.1 mg/dL (ref 8.4–10.5)
Chloride: 103 mEq/L (ref 96–112)
Creatinine, Ser: 1.26 mg/dL (ref 0.40–1.50)
GFR: 70 mL/min (ref >60.00)
Glucose, Bld: 119 mg/dL — ABNORMAL HIGH (ref 70–99)
Potassium: 5.5 mEq/L — ABNORMAL HIGH (ref 3.5–5.1)
Sodium: 138 mEq/L (ref 135–145)

## 2018-11-27 LAB — HEMOGLOBIN A1C: Hgb A1c MFr Bld: 7 % — ABNORMAL HIGH (ref 4.6–6.5)

## 2018-12-01 ENCOUNTER — Encounter: Payer: Self-pay | Admitting: Endocrinology

## 2018-12-01 ENCOUNTER — Ambulatory Visit (INDEPENDENT_AMBULATORY_CARE_PROVIDER_SITE_OTHER): Payer: Medicare HMO | Admitting: Endocrinology

## 2018-12-01 ENCOUNTER — Other Ambulatory Visit: Payer: Self-pay

## 2018-12-01 DIAGNOSIS — E875 Hyperkalemia: Secondary | ICD-10-CM | POA: Diagnosis not present

## 2018-12-01 DIAGNOSIS — E1165 Type 2 diabetes mellitus with hyperglycemia: Secondary | ICD-10-CM | POA: Diagnosis not present

## 2018-12-01 DIAGNOSIS — Z794 Long term (current) use of insulin: Secondary | ICD-10-CM | POA: Diagnosis not present

## 2018-12-01 DIAGNOSIS — I10 Essential (primary) hypertension: Secondary | ICD-10-CM | POA: Diagnosis not present

## 2018-12-01 MED ORDER — FARXIGA 5 MG PO TABS: 5.0000 mg | ORAL_TABLET | Freq: Every day | ORAL | 1 refills | Status: DC

## 2018-12-01 NOTE — Progress Notes (Signed)
Patient ID: Nicholas Schroeder, male   DOB: 1956-05-17, 63 y.o.   MRN: [id number]            Reason for Appointment: Follow-up for Type 2 Diabetes  Referring physician: Suzanna Obey   Today's office visit was provided via telemedicine using a phone call . Consent for the patient has been obtained . Location of the patient: Home . Location of the provider: Office Only the patient and myself were participating in the encounter    History of Present Illness:          Date of diagnosis of type 2 diabetes mellitus: ? 2008        Background history:   Patient is unclear about when he was diagnosed to have diabetes and how his diagnosis was made Has been on metformin since diagnosis but not clear if he took other diabetes medications He was however started on insulin when his blood sugars are markedly increased in 2011 He thinks he has been on Lantus insulin all along with minimal change in doses  Recent history:   INSULIN regimen is: Lantus  30 in a.m. and 28 hs p.m.       Non-insulin hypoglycemic drugs the patient is taking are: Metformin 2000 mg daily, Invokana 100 mg daily  His A1c is relatively better at 7% compared to 7.4  The previous range 6.4-7.4  Current management, blood sugar patterns and problems identified:  His blood sugars are overall fairly good although fluctuating a little  He is usually however checking his blood sugars only before breakfast and sometimes before dinner  He is however trying to do regular walking up to 1 hour a day  Appears to have kept his weight down which he had lost earlier this year  He may be getting more carbohydrates with fruits like watermelon, cantaloupe and bananas  No hypoglycemic symptoms and lowest blood sugar is only 98 recently  His metformin has been changed to regular metformin instead of ER and this may have been because of his ER medication being recalled and he has questions about this  Currently no diarrhea from  regular metformin and renal function normal  He uses an insulin pen and despite his poor eyesight he thinks he can see the markings well  Side effects from medications have been: None  Compliance with the medical regimen: Improved  Glucose monitoring:  done up to 2 times a day         Glucometer:  Accu-Chek .       Blood Glucose readings by time of day from patient relating his meter readings:  FASTING range 98-189  Before dinner range 91-150  Self-care: He likes eating a lot of fruits  .     Meal times are: Usually no breakfast, lunch: 12pm Dinner: 6-7                 Dietician visit, most recent: 07/02/16                Weight history:  Wt Readings from Last 3 Encounters:  04/20/18 267 lb 9.6 oz (121.4 kg)  01/12/18 262 lb (118.8 kg)  10/12/17 258 lb 12.8 oz (117.4 kg)    Glycemic control:   Lab Results  Component Value Date   HGBA1C 7.0 (H) 11/27/2018   HGBA1C 7.4 (H) 08/24/2018   HGBA1C 7.1 (H) 04/18/2018   Lab Results  Component Value Date   MICROALBUR <0.7 08/24/2018   [place] 73 08/24/2018  CREATININE 1.26 11/27/2018   Lab Results  Component Value Date   MICRALBCREAT 0.7 08/24/2018     Lab Results  Component Value Date   FRUCTOSAMINE 227 01/04/2017   FRUCTOSAMINE 238 11/02/2016        Allergies as of 12/01/2018   No Known Allergies     Medication List       Accurate as of December 01, 2018  9:56 AM. If you have any questions, ask your nurse or doctor.        aspirin 81 MG tablet Take 81 mg by mouth daily.   atorvastatin 10 MG tablet Commonly known as: LIPITOR Take 10 mg by mouth daily.   blood glucose meter kit and supplies Kit by Does not apply route. As directed   Droplet Pen Needles 31G X 5 MM Misc Generic drug: Insulin Pen Needle USE WITH INSULIN PEN AS DIRECTED   glucose blood test strip Use accu chek smartview strips as instructed to check blood sugar three times daily.   hydrochlorothiazide 12.5 MG capsule Commonly  known as: MICROZIDE TAKE 1 CAPSULE EVERY DAY   Insulin Glargine 100 UNIT/ML Solostar Pen Commonly known as: Lantus SoloStar INJECT 30 UNITS IN THE MORNING AND 28 UNITS IN THE EVENING   Invokana 100 MG Tabs tablet Generic drug: canagliflozin TAKE 1 TABLET DAILY BEFORE BREAKFAST.   metFORMIN 500 MG tablet Commonly known as: GLUCOPHAGE TAKE 2 TABLETS TWICE DAILY WITH MEALS   OVER THE COUNTER MEDICATION   telmisartan 20 MG tablet Commonly known as: MICARDIS TAKE 1 TABLET EVERY DAY       Allergies: No Known Allergies  Past Medical History:  Diagnosis Date  . Diabetes mellitus, type II ([place])   . HLD (hyperlipidemia)   . HTN (hypertension)   . Obesity     Past Surgical History:  Procedure Laterality Date  . ABDOMINAL SURGERY      Family History  Problem Relation Age of Onset  . Diabetes Mother   . Diabetes Father   . Diabetes Brother     Social History:  reports that he has never smoked. He has never used smokeless tobacco. He reports that he does not drink alcohol or use drugs.   Review of Systems   Lipid history: LDL below 70, he has been on Lipitor 10 mg daily, not clear if he is taking this    Lab Results  Component Value Date   CHOL 128 08/24/2018   HDL 41.00 08/24/2018   LDLCALC 73 08/24/2018   TRIG 71.0 08/24/2018   CHOLHDL 3 08/24/2018           Hypertension:  Treated with taking 20 MG telmisartan by PCP along with HCTZ He checks blood pressures at home and the last reading was 116/65 No complaints of lightheadedness   BP Readings from Last 3 Encounters:  04/20/18 124/80  01/12/18 130/90  10/12/17 (!) 144/80   Has had high potassium previously and this is again high Lately he has had more fruits like cantaloupe some bananas  Lab Results  Component Value Date   K 5.5 (H) 11/27/2018    He does have regular eye exams, no retinopathy, last documented exam 8/19  Most recent foot exam:1/19 with his pcp Does not see podiatrist    Physical Examination:  There were no vitals taken for this visit.   ASSESSMENT:  Diabetes type 2, with obesity, insulin requiring  See history of present illness for detailed discussion of current diabetes management, blood sugar patterns and  problems identified  His A1c is slightly better at 7% compared to 7.4  He is still on regimen of twice a day Lantus insulin, metformin and Invokana 100 mg  His blood sugars are difficult to assess as he checks only fasting readings and occasionally before dinner This is despite reminding him to do more readings after meals and this may improve his diet also Again discussed when to check his blood sugar  He also is eating too many fruits and asked him to cut back especially with his hyperkalemia  Because of his hyperkalemia he will be switched from [place] to [place] Otherwise insulin dose will be continued unchanged  Follow-up in 3 months  Needs foot exam at next visit in office Recommended regular eye exam  HYPERTENSION: Blood pressure is low normal Because of his being on multiple medications and Invokana he likely needs less intense regimen He will stop his HCTZ for now Continue to monitor regularly at home He will follow-up with his PCP also  HYPERKALEMIA: This is likely to be from a combination of Invokana and telmisartan 20 mg  He will be switched to [place] instead of Invokana and discussed the differences Consider combination of Farxiga and metformin for convenience on the next visit    There are no Patient Instructions on file for this visit.  Telephone encounter duration = 11 minutes  Elayne Snare 12/01/2018, 9:56 AM   Note: This office note was prepared with Dragon voice recognition system technology. Any transcriptional errors that result from this process are unintentional.

## 2018-12-27 ENCOUNTER — Other Ambulatory Visit: Payer: Self-pay | Admitting: Endocrinology

## 2019-01-13 ENCOUNTER — Other Ambulatory Visit: Payer: Self-pay | Admitting: Endocrinology

## 2019-02-07 ENCOUNTER — Other Ambulatory Visit: Payer: Self-pay | Admitting: Endocrinology

## 2019-03-02 ENCOUNTER — Other Ambulatory Visit: Payer: Self-pay

## 2019-03-02 ENCOUNTER — Other Ambulatory Visit (INDEPENDENT_AMBULATORY_CARE_PROVIDER_SITE_OTHER): Payer: Medicare HMO

## 2019-03-02 DIAGNOSIS — E1165 Type 2 diabetes mellitus with hyperglycemia: Secondary | ICD-10-CM | POA: Diagnosis not present

## 2019-03-02 DIAGNOSIS — Z794 Long term (current) use of insulin: Secondary | ICD-10-CM

## 2019-03-02 LAB — BASIC METABOLIC PANEL
BUN: 16 mg/dL (ref 6–23)
CO2: 30 mEq/L (ref 19–32)
Calcium: 9.5 mg/dL (ref 8.4–10.5)
Chloride: 105 mEq/L (ref 96–112)
Creatinine, Ser: 1.23 mg/dL (ref 0.40–1.50)
GFR: 71.91 mL/min (ref >60.00)
Glucose, Bld: 141 mg/dL — ABNORMAL HIGH (ref 70–99)
Potassium: 4.9 mEq/L (ref 3.5–5.1)
Sodium: 142 mEq/L (ref 135–145)

## 2019-03-02 LAB — HEMOGLOBIN A1C: Hgb A1c MFr Bld: 7 % — ABNORMAL HIGH (ref 4.6–6.5)

## 2019-03-06 ENCOUNTER — Other Ambulatory Visit: Payer: Self-pay

## 2019-03-08 ENCOUNTER — Other Ambulatory Visit: Payer: Self-pay

## 2019-03-08 ENCOUNTER — Ambulatory Visit (INDEPENDENT_AMBULATORY_CARE_PROVIDER_SITE_OTHER): Payer: Medicare HMO | Admitting: Endocrinology

## 2019-03-08 ENCOUNTER — Encounter: Payer: Self-pay | Admitting: Endocrinology

## 2019-03-08 VITALS — BP 124/72 | HR 81 | Ht 69.0 in | Wt 263.8 lb

## 2019-03-08 DIAGNOSIS — E1165 Type 2 diabetes mellitus with hyperglycemia: Secondary | ICD-10-CM | POA: Diagnosis not present

## 2019-03-08 DIAGNOSIS — Z23 Encounter for immunization: Secondary | ICD-10-CM | POA: Diagnosis not present

## 2019-03-08 DIAGNOSIS — Z794 Long term (current) use of insulin: Secondary | ICD-10-CM | POA: Diagnosis not present

## 2019-03-08 LAB — GLUCOSE, POCT (MANUAL RESULT ENTRY): POC Glucose: 74 mg/dl (ref 70–99)

## 2019-03-08 NOTE — Progress Notes (Signed)
Patient ID: Nicholas Schroeder, male   DOB: [birth year], 63 y.o.   MRN: [id number]            Reason for Appointment: Follow-up for Type 2 Diabetes  Referring physician: Suzanna Obey    History of Present Illness:          Date of diagnosis of type 2 diabetes mellitus: ? 2008        Background history:   Patient is unclear about when he was diagnosed to have diabetes and how his diagnosis was made Has been on metformin since diagnosis but not clear if he took other diabetes medications He was however started on insulin when his blood sugars are markedly increased in 2011 He thinks he has been on Lantus insulin all along with minimal change in doses  Recent history:   INSULIN regimen is: Lantus  30 in a.m. and 28 hs p.m.       Non-insulin hypoglycemic drugs the patient is taking are: Metformin 2000 mg daily, 100 mg daily  His A1c is stable at 7%  The previous range 6.4-7.4  Current management, blood sugar patterns and problems identified:  His blood sugars could not be assessed with monitor download today since he did not bring this  Insulin dose has not been changed for some time  Appears to have lost a little weight since last December  He is trying to walk every other day about 30-45 minutes  His diet can be somewhat variable especially when he eats more fruits  Otherwise he thinks he has had only one high reading of 200 after eating  Has been regular with taking his insulin injections   Side effects from medications have been: None  Compliance with the medical regimen: Improved  Glucose monitoring:  done up to 2 times a day         Glucometer:  Accu-Chek .       Blood Glucose readings by time of day by recall:   PRE-MEAL Fasting Lunch Dinner Bedtime Overall  Glucose range: <100      Mean/median:        POST-MEAL PC Breakfast PC Lunch PC Dinner  Glucose range:   < 170  Mean/median:      Lab fasting glucose 141 and today 74 fasting  Self-care: He likes  eating a lot of fruits  .     Meal times are: Usually no breakfast, lunch: 12pm Dinner: 6-7                 Dietician visit, most recent: 07/02/16                Weight history:  Wt Readings from Last 3 Encounters:  03/08/19 263 lb 12.8 oz (119.7 kg)  04/20/18 267 lb 9.6 oz (121.4 kg)  01/12/18 262 lb (118.8 kg)    Glycemic control:   Lab Results  Component Value Date   HGBA1C 7.0 (H) 03/02/2019   HGBA1C 7.0 (H) 11/27/2018   HGBA1C 7.4 (H) 08/24/2018   Lab Results  Component Value Date   MICROALBUR <0.7 08/24/2018   LDLCALC 73 08/24/2018   CREATININE 1.23 03/02/2019   Lab Results  Component Value Date   MICRALBCREAT 0.7 08/24/2018     Lab Results  Component Value Date   FRUCTOSAMINE 227 01/04/2017   FRUCTOSAMINE 238 11/02/2016        Allergies as of 03/08/2019   No Known Allergies     Medication List  Accurate as of March 08, 2019  4:38 PM. If you have any questions, ask your nurse or doctor.        STOP taking these medications   Invokana 100 MG Tabs tablet Generic drug: canagliflozin Stopped by: Elayne Snare, MD     TAKE these medications   Accu-Chek SmartView test strip Generic drug: glucose blood TEST BLOOD SUGAR THREE TIMES DAILY   aspirin 81 MG tablet Take 81 mg by mouth daily.   atorvastatin 10 MG tablet Commonly known as: LIPITOR Take 10 mg by mouth daily.   blood glucose meter kit and supplies Kit by Does not apply route. As directed   Droplet Pen Needles 31G X 5 MM Misc Generic drug: Insulin Pen Needle USE WITH INSULIN PEN AS DIRECTED   Farxiga 5 MG Tabs tablet Generic drug: dapagliflozin propanediol Take 5 mg by mouth daily.   hydrochlorothiazide 12.5 MG capsule Commonly known as: MICROZIDE TAKE 1 CAPSULE EVERY DAY   Insulin Glargine 100 UNIT/ML Solostar Pen Commonly known as: Lantus SoloStar INJECT 30 UNITS IN THE MORNING AND 28 UNITS IN THE EVENING   metFORMIN 500 MG tablet Commonly known as: GLUCOPHAGE  TAKE 2 TABLETS TWICE DAILY WITH MEALS   OVER THE COUNTER MEDICATION   telmisartan 20 MG tablet Commonly known as: MICARDIS TAKE 1 TABLET EVERY DAY       Allergies: No Known Allergies  Past Medical History:  Diagnosis Date  . Diabetes mellitus, type II ([place])   . HLD (hyperlipidemia)   . HTN (hypertension)   . Obesity     Past Surgical History:  Procedure Laterality Date  . ABDOMINAL SURGERY      Family History  Problem Relation Age of Onset  . Diabetes Mother   . Diabetes Father   . Diabetes Brother     Social History:  reports that he has never smoked. He has never used smokeless tobacco. He reports that he does not drink alcohol or use drugs.   Review of Systems   Lipid history: LDL below 70, he has been on Lipitor 10 mg daily, not clear if he is taking this    Lab Results  Component Value Date   CHOL 128 08/24/2018   HDL 41.00 08/24/2018   LDLCALC 73 08/24/2018   TRIG 71.0 08/24/2018   CHOLHDL 3 08/24/2018           Hypertension:  Treated with taking 20 MG telmisartan by PCP along with HCTZ He checks blood pressures at home and the last reading was 140/80 + Blood pressure has been variable  BP Readings from Last 3 Encounters:  03/08/19 124/72  04/20/18 124/80  01/12/18 130/90   Has had high potassium previously and this is normal with stopping Invokana  Lab Results  Component Value Date   K 4.9 03/02/2019    He does have regular eye exams, no retinopathy, last documented exam 8/19  Most recent foot exam: 02/2019  Does not see podiatrist   Physical Examination:  BP 124/72 (BP Location: Left Arm, Patient Position: Sitting, Cuff Size: Large)   Pulse 81   Ht 5' 9"  (1.753 m)   Wt 263 lb 12.8 oz (119.7 kg)   SpO2 97%   BMI 38.96 kg/m   Diabetic Foot Exam - Simple   Simple Foot Form Diabetic Foot exam was performed with the following findings: Yes   Visual Inspection No deformities, no ulcerations, no other skin breakdown  bilaterally: Yes Sensation Testing Intact to touch and monofilament testing  bilaterally: Yes Pulse Check Posterior Tibialis and Dorsalis pulse intact bilaterally: Yes Comments    Repeat blood pressure was normal  ASSESSMENT:  Diabetes type 2, with obesity, insulin requiring  See history of present illness for detailed discussion of current diabetes management, blood sugar patterns and problems identified  His A1c is still 7%  He is still on regimen of twice a day Lantus insulin, metformin and Farxiga 5 mg  His blood sugars are difficult to assess as he did not bring his monitor As before he probably takes only readings in the morning and only rarely after meals By history he is not having consistently high readings at any time Overall control is similar to when he was on Invokana, this was stopped because of hyperkalemia Weight is stable Is trying to walk regularly  Recommend that he start checking his blood sugars after meals regularly He will get new battery for his meter No change in insulin unless blood sugars are starting to get out of range   HYPERTENSION: Blood pressure well controlled and he is back on HCTZ, also follows up with PCP  Influenza vaccine given    There are no Patient Instructions on file for this visit.    Elayne Snare 03/08/2019, 4:38 PM   Note: This office note was prepared with Dragon voice recognition system technology. Any transcriptional errors that result from this process are unintentional.

## 2019-03-30 ENCOUNTER — Other Ambulatory Visit: Payer: Self-pay | Admitting: Endocrinology

## 2019-04-11 ENCOUNTER — Other Ambulatory Visit: Payer: Self-pay | Admitting: Endocrinology

## 2019-04-26 LAB — HM DIABETES EYE EXAM

## 2019-06-06 ENCOUNTER — Other Ambulatory Visit: Payer: Self-pay | Admitting: Endocrinology

## 2019-06-07 ENCOUNTER — Ambulatory Visit: Payer: Medicare HMO | Admitting: Endocrinology

## 2019-06-11 ENCOUNTER — Other Ambulatory Visit: Payer: Self-pay

## 2019-06-11 ENCOUNTER — Other Ambulatory Visit (INDEPENDENT_AMBULATORY_CARE_PROVIDER_SITE_OTHER): Payer: Medicare HMO

## 2019-06-11 DIAGNOSIS — E1165 Type 2 diabetes mellitus with hyperglycemia: Secondary | ICD-10-CM | POA: Diagnosis not present

## 2019-06-11 DIAGNOSIS — Z794 Long term (current) use of insulin: Secondary | ICD-10-CM

## 2019-06-11 LAB — BASIC METABOLIC PANEL
BUN: 17 mg/dL (ref 6–23)
CO2: 28 mEq/L (ref 19–32)
Calcium: 9.3 mg/dL (ref 8.4–10.5)
Chloride: 104 mEq/L (ref 96–112)
Creatinine, Ser: 1.21 mg/dL (ref 0.40–1.50)
GFR: 73.22 mL/min (ref >60.00)
Glucose, Bld: 102 mg/dL — ABNORMAL HIGH (ref 70–99)
Potassium: 4.8 mEq/L (ref 3.5–5.1)
Sodium: 139 mEq/L (ref 135–145)

## 2019-06-11 LAB — HEMOGLOBIN A1C: Hgb A1c MFr Bld: 6.7 % — ABNORMAL HIGH (ref 4.6–6.5)

## 2019-06-13 ENCOUNTER — Telehealth: Payer: Self-pay

## 2019-06-13 NOTE — Telephone Encounter (Signed)
Called pt and attempted to get blood sugar logs for the last 7 days prior to tomorrows virtual visit. Left detailed voicemail for pt with this information and requested a call back.

## 2019-06-13 NOTE — Telephone Encounter (Signed)
Attempted to call pt again and he answered and blood sugars were obtained.

## 2019-06-14 ENCOUNTER — Ambulatory Visit (INDEPENDENT_AMBULATORY_CARE_PROVIDER_SITE_OTHER): Payer: Medicare HMO | Admitting: Endocrinology

## 2019-06-14 ENCOUNTER — Other Ambulatory Visit: Payer: Self-pay

## 2019-06-14 ENCOUNTER — Encounter: Payer: Self-pay | Admitting: Endocrinology

## 2019-06-14 DIAGNOSIS — E1169 Type 2 diabetes mellitus with other specified complication: Secondary | ICD-10-CM

## 2019-06-14 DIAGNOSIS — I10 Essential (primary) hypertension: Secondary | ICD-10-CM | POA: Diagnosis not present

## 2019-06-14 DIAGNOSIS — E669 Obesity, unspecified: Secondary | ICD-10-CM

## 2019-06-14 NOTE — Progress Notes (Signed)
Patient ID: Nicholas Schroeder, male   DOB: May 29, 1955, 64 y.o.   MRN: [id number]            Reason for Appointment: Follow-up for Type 2 Diabetes  Referring physician: Suzanna Obey  Today's office visit was provided via telemedicine using a telephone call to the patient Patient has been explained the limitations of evaluation and management by telemedicine and the availability of in person appointments.  The patient understood the limitations and agreed to proceed. Patient also understood that the telehealth visit is billable. . Location of the patient: Home . Location of the provider: Office Only the patient and myself were participating in the encounter   History of Present Illness:          Date of diagnosis of type 2 diabetes mellitus: ? 2008        Background history:   Patient is unclear about when he was diagnosed to have diabetes and how his diagnosis was made Has been on metformin since diagnosis but not clear if he took other diabetes medications He was however started on insulin when his blood sugars are markedly increased in 2011 He thinks he has been on Lantus insulin all along with minimal change in doses  Recent history:   INSULIN regimen is: Lantus  30 in a.m. and 28 hs p.m.       Non-insulin hypoglycemic drugs the patient is taking are: Metformin 2000 mg daily, Farxiga 5 mg daily  His A1c is slightly better at 6.7, was at 7%  The previous range 6.4-7.4  Current management, blood sugar patterns and problems identified:  His blood sugars are available only for about 3 days and he is not able to give [place] his readings on the phone  He thinks he has some readings in the 80s before his first meal which is usually midday but does not feel hypoglycemic  Has only a couple of readings after meals which appear to be fairly good  He is taking [place] instead of Invokana because of tendency to high potassium  He takes his insulin on waking up and then around  suppertime even though he does not eat breakfast  He is walking about 3 days a week, less compared to when the weather was better  His weight is about the same as last year  He thinks his diet is usually fairly healthy and not a lot of restaurant food   Side effects from medications have been: None  Compliance with the medical regimen: Improved  Glucose monitoring:  done up to 2 times a day         Glucometer:  Accu-Chek .       Blood Glucose readings by time of day by patient review of monitor:   PRE-MEAL Fasting Lunch Dinner Bedtime Overall  Glucose range:  80   116  86   Mean/median:     ?   POST-MEAL PC Breakfast PC Lunch PC Dinner  Glucose range:    147  Mean/median:   155    Lab fasting glucose 102  Self-care: Diet: Avoiding excess sweets  .     Meal times are: Usually no breakfast, lunch: 12pm Dinner: 6-7                 Dietician visit, most recent: 07/02/16                Weight history:  Wt Readings from Last 3 Encounters:  03/08/19 263 lb 12.8 oz (  119.7 kg)  04/20/18 267 lb 9.6 oz (121.4 kg)  01/12/18 262 lb (118.8 kg)    Glycemic control:   Lab Results  Component Value Date   HGBA1C 6.7 (H) 06/11/2019   HGBA1C 7.0 (H) 03/02/2019   HGBA1C 7.0 (H) 11/27/2018   Lab Results  Component Value Date   MICROALBUR <0.7 08/24/2018   LDLCALC 73 08/24/2018   CREATININE 1.21 06/11/2019   Lab Results  Component Value Date   MICRALBCREAT 0.7 08/24/2018     Lab Results  Component Value Date   FRUCTOSAMINE 227 01/04/2017   FRUCTOSAMINE 238 11/02/2016        Allergies as of 06/14/2019   No Known Allergies     Medication List       Accurate as of June 14, 2019  8:55 AM. If you have any questions, ask your nurse or doctor.        Accu-Chek SmartView test strip Generic drug: glucose blood TEST BLOOD SUGAR THREE TIMES DAILY   aspirin 81 MG tablet Take 81 mg by mouth daily.   atorvastatin 10 MG tablet Commonly known as: LIPITOR Take 10  mg by mouth daily.   blood glucose meter kit and supplies Kit by Does not apply route. As directed   Droplet Pen Needles 31G X 5 MM Misc Generic drug: Insulin Pen Needle USE WITH INSULIN PEN AS DIRECTED   Farxiga 5 MG Tabs tablet Generic drug: dapagliflozin propanediol TAKE 1 TABLET EVERY DAY (REPLACES INVOKANA)   hydrochlorothiazide 12.5 MG capsule Commonly known as: MICROZIDE TAKE 1 CAPSULE EVERY DAY   Insulin Glargine 100 UNIT/ML Solostar Pen Commonly known as: Lantus SoloStar INJECT 30 UNITS IN THE MORNING AND 28 UNITS IN THE EVENING   metFORMIN 500 MG tablet Commonly known as: GLUCOPHAGE TAKE 2 TABLETS TWICE DAILY WITH MEALS   OVER THE COUNTER MEDICATION   telmisartan 20 MG tablet Commonly known as: MICARDIS TAKE 1 TABLET EVERY DAY       Allergies: No Known Allergies  Past Medical History:  Diagnosis Date  . Diabetes mellitus, type II ([place])   . HLD (hyperlipidemia)   . HTN (hypertension)   . Obesity     Past Surgical History:  Procedure Laterality Date  . ABDOMINAL SURGERY      Family History  Problem Relation Age of Onset  . Diabetes Mother   . Diabetes Father   . Diabetes Brother     Social History:  reports that he has never smoked. He has never used smokeless tobacco. He reports that he does not drink alcohol or use drugs.   Review of Systems   Lipid history: LDL below 70, he has been on Lipitor 10 mg daily, not clear if he is taking this    Lab Results  Component Value Date   CHOL 128 08/24/2018   HDL 41.00 08/24/2018   LDLCALC 73 08/24/2018   TRIG 71.0 08/24/2018   CHOLHDL 3 08/24/2018           Hypertension:  Treated with taking 20 MG telmisartan by PCP along with HCTZ He checks blood pressures at home and the last reading was 140/80 + Blood pressure has been variable  BP Readings from Last 3 Encounters:  03/08/19 124/72  04/20/18 124/80  01/12/18 130/90   Has had high potassium previously and this is normal with stopping  Invokana  Lab Results  Component Value Date   K 4.8 06/11/2019    He does have regular eye exams, no retinopathy, last  documented exam 1/21 with [facility]  Most recent foot exam: 02/2019     Physical Examination:  There were no vitals taken for this visit.  ASSESSMENT:  Diabetes type 2, with obesity, insulin requiring  See history of present illness for detailed discussion of current diabetes management, blood sugar patterns and problems identified  His A1c is slightly better at 6.7 compared to 7%  He is on regimen of twice a day Lantus insulin, metformin and Farxiga 5 mg  His A1c indicates overall good control No obvious postprandial hyperglycemia despite taking only basal insulin without mealtime shots Has benefited from SGLT2 drugs His weight is still difficult to improve despite reasonably good diet and trying to walk  His blood sugars may be low normal midday with his taking Lantus in the morning but no hypoglycemia  Recommend that he reduce his morning Lantus to 28 units He will bring his meter for download Discussed checking blood sugars periodically 2 hours after meals  HYPERTENSION: Blood pressure at home is high normal but usually better in the office We will need to have him come into the office for measurement  Renal function and potassium stable We will check microalbumin on the next visit   There are no Patient Instructions on file for this visit.  Duration of telephone encounter =8 minutes  Elayne Snare 06/14/2019, 8:55 AM   Note: This office note was prepared with Dragon voice recognition system technology. Any transcriptional errors that result from this process are unintentional.

## 2019-06-29 ENCOUNTER — Other Ambulatory Visit: Payer: Self-pay | Admitting: Endocrinology

## 2019-08-10 ENCOUNTER — Encounter: Payer: Self-pay | Admitting: Gastroenterology

## 2019-08-15 ENCOUNTER — Other Ambulatory Visit: Payer: Self-pay | Admitting: Endocrinology

## 2019-08-29 ENCOUNTER — Other Ambulatory Visit: Payer: Self-pay | Admitting: Endocrinology

## 2019-09-03 ENCOUNTER — Ambulatory Visit (AMBULATORY_SURGERY_CENTER): Payer: Self-pay | Admitting: *Deleted

## 2019-09-03 ENCOUNTER — Other Ambulatory Visit: Payer: Self-pay

## 2019-09-03 VITALS — Temp 97.3°F | Ht 69.0 in | Wt 265.0 lb

## 2019-09-03 DIAGNOSIS — Z1211 Encounter for screening for malignant neoplasm of colon: Secondary | ICD-10-CM

## 2019-09-03 DIAGNOSIS — Z01818 Encounter for other preprocedural examination: Secondary | ICD-10-CM

## 2019-09-03 MED ORDER — SUPREP BOWEL PREP KIT 17.5-3.13-1.6 GM/177ML PO SOLN: 1.0000 | Freq: Once | ORAL | 0 refills | Status: AC

## 2019-09-03 NOTE — Progress Notes (Signed)

## 2019-09-05 ENCOUNTER — Other Ambulatory Visit: Payer: Self-pay | Admitting: Endocrinology

## 2019-09-11 ENCOUNTER — Other Ambulatory Visit (INDEPENDENT_AMBULATORY_CARE_PROVIDER_SITE_OTHER): Payer: Medicare HMO

## 2019-09-11 ENCOUNTER — Other Ambulatory Visit: Payer: Self-pay

## 2019-09-11 DIAGNOSIS — E1169 Type 2 diabetes mellitus with other specified complication: Secondary | ICD-10-CM | POA: Diagnosis not present

## 2019-09-11 DIAGNOSIS — E669 Obesity, unspecified: Secondary | ICD-10-CM

## 2019-09-11 LAB — COMPREHENSIVE METABOLIC PANEL
ALT: 18 U/L (ref 0–53)
AST: 16 U/L (ref 0–37)
Albumin: 4.2 g/dL (ref 3.5–5.2)
Alkaline Phosphatase: 86 U/L (ref 39–117)
BUN: 17 mg/dL (ref 6–23)
CO2: 30 mEq/L (ref 19–32)
Calcium: 9.4 mg/dL (ref 8.4–10.5)
Chloride: 104 mEq/L (ref 96–112)
Creatinine, Ser: 1.18 mg/dL (ref 0.40–1.50)
GFR: 75.31 mL/min (ref >60.00)
Glucose, Bld: 83 mg/dL (ref 70–99)
Potassium: 5.4 mEq/L — ABNORMAL HIGH (ref 3.5–5.1)
Sodium: 140 mEq/L (ref 135–145)
Total Bilirubin: 0.5 mg/dL (ref 0.2–1.2)
Total Protein: 7.1 g/dL (ref 6.0–8.3)

## 2019-09-11 LAB — LIPID PANEL
Cholesterol: 124 mg/dL (ref 0–200)
HDL: 37.3 mg/dL — ABNORMAL LOW (ref >39.00)
LDL Cholesterol: 71 mg/dL (ref 0–99)
NonHDL: 86.35
Total CHOL/HDL Ratio: 3
Triglycerides: 79 mg/dL (ref 0.0–149.0)
VLDL: 15.8 mg/dL (ref 0.0–40.0)

## 2019-09-11 LAB — MICROALBUMIN / CREATININE URINE RATIO
Creatinine,U: 108.7 mg/dL
Microalb Creat Ratio: 0.6 mg/g (ref 0.0–30.0)
Microalb, Ur: <0.7 mg/dL (ref 0.0–1.9)

## 2019-09-11 LAB — HEMOGLOBIN A1C: Hgb A1c MFr Bld: 6.8 % — ABNORMAL HIGH (ref 4.6–6.5)

## 2019-09-13 ENCOUNTER — Other Ambulatory Visit: Payer: Self-pay | Admitting: Gastroenterology

## 2019-09-13 ENCOUNTER — Ambulatory Visit (INDEPENDENT_AMBULATORY_CARE_PROVIDER_SITE_OTHER): Payer: Medicare HMO

## 2019-09-13 DIAGNOSIS — Z1159 Encounter for screening for other viral diseases: Secondary | ICD-10-CM

## 2019-09-13 LAB — SARS CORONAVIRUS 2 (TAT 6-24 HRS): SARS Coronavirus 2: NEGATIVE

## 2019-09-13 NOTE — Progress Notes (Signed)
Patient ID: Nicholas Schroeder, male   DOB: 12-22-1955, 63 y.o.   MRN: [id number]            Reason for Appointment: Follow-up  Referring physician: Suzanna Obey   History of Present Illness:          Date of diagnosis of type 2 diabetes mellitus: ? 2008        Background history:   Patient is unclear about when he was diagnosed to have diabetes and how his diagnosis was made Has been on metformin since diagnosis but not clear if he took other diabetes medications He was however started on insulin when his blood sugars are markedly increased in 2011 He thinks he has been on Lantus insulin all along with minimal change in doses  Recent history:   INSULIN regimen is: Lantus  28 in a.m. and 28 hs p.m.       Non-insulin hypoglycemic drugs the patient is taking are: Metformin 2000 mg daily, Farxiga 5 mg daily  His A1c is 6.8  The previous range 6.4-7.4  Current management, blood sugar patterns and problems identified:  His meter is several years old and is not being downloaded on our software  However his lab glucose was similar to his home reading the same morning  His morning Lantus was reduced on the last visit by 2 units  With this his blood sugars are not low normal at dinnertime  He is mostly checking blood sugar before breakfast and dinner  Not clear what his blood sugars are after meals  Has been very consistent with taking his [place] and Metformin as directed  He does try to walk regularly although slowly, he mostly walks about a block a day and takes an hour to do this   Side effects from medications have been: None  Compliance with the medical regimen: Improved  Glucose monitoring:  done up to 2 times a day         Glucometer:  Accu-Chek Nano.       Blood Glucose readings by time of day by review of monitor:   PRE-MEAL Fasting Lunch Dinner Bedtime Overall  Glucose range:  86-134  165  87-174    Mean/median:      143   Previous readings:  PRE-MEAL  Fasting Lunch Dinner Bedtime Overall  Glucose range:  80   116  86   Mean/median:     ?   POST-MEAL PC Breakfast PC Lunch PC Dinner  Glucose range:    147  Mean/median:   155      Self-care: Diet: Avoiding excess sweets  .     Meal times are: Usually no breakfast, lunch: 12pm Dinner: 6-7                 Dietician visit, most recent: 07/02/16                Weight history:  Wt Readings from Last 3 Encounters:  09/14/19 266 lb (120.7 kg)  09/03/19 265 lb (120.2 kg)  03/08/19 263 lb 12.8 oz (119.7 kg)    Glycemic control:   Lab Results  Component Value Date   HGBA1C 6.8 (H) 09/11/2019   HGBA1C 6.7 (H) 06/11/2019   HGBA1C 7.0 (H) 03/02/2019   Lab Results  Component Value Date   MICROALBUR <0.7 09/11/2019   LDLCALC 71 09/11/2019   CREATININE 1.18 09/11/2019   Lab Results  Component Value Date   MICRALBCREAT 0.6 09/11/2019  Lab Results  Component Value Date   FRUCTOSAMINE 227 01/04/2017   FRUCTOSAMINE 238 11/02/2016        Allergies as of 09/14/2019   No Known Allergies     Medication List       Accurate as of September 14, 2019  8:41 AM. If you have any questions, ask your nurse or doctor.        Accu-Chek SmartView test strip Generic drug: glucose blood TEST BLOOD SUGAR THREE TIMES DAILY   amLODipine 5 MG tablet Commonly known as: NORVASC Take by mouth.   aspirin 81 MG tablet Take 81 mg by mouth daily.   atorvastatin 10 MG tablet Commonly known as: LIPITOR Take 10 mg by mouth daily.   blood glucose meter kit and supplies Kit by Does not apply route. As directed   Droplet Pen Needles 31G X 5 MM Misc Generic drug: Insulin Pen Needle USE WITH INSULIN PEN AS DIRECTED   Farxiga 5 MG Tabs tablet Generic drug: dapagliflozin propanediol TAKE 1 TABLET EVERY DAY (REPLACES INVOKANA)   hydrochlorothiazide 12.5 MG capsule Commonly known as: MICROZIDE TAKE 1 CAPSULE EVERY DAY   Lantus SoloStar 100 UNIT/ML Solostar Pen Generic drug: insulin  glargine INJECT 28 UNITS IN THE MORNING AND 28 UNITS IN THE EVENING   metFORMIN 500 MG tablet Commonly known as: GLUCOPHAGE TAKE 2 TABLETS TWICE DAILY WITH MEALS   OVER THE COUNTER MEDICATION   telmisartan 20 MG tablet Commonly known as: MICARDIS TAKE 1 TABLET EVERY DAY       Allergies: No Known Allergies  Past Medical History:  Diagnosis Date  . Cataract    forming both per pt   . Diabetes mellitus, type II ([place])   . HLD (hyperlipidemia)   . HTN (hypertension)   . Obesity     Past Surgical History:  Procedure Laterality Date  . ABDOMINAL SURGERY      Family History  Problem Relation Age of Onset  . Diabetes Mother   . Diabetes Father   . Diabetes Brother   . Colon polyps Sister   . Esophageal cancer Neg Hx   . Rectal cancer Neg Hx   . Stomach cancer Neg Hx   . Colon cancer Neg Hx     Social History:  reports that he has never smoked. He has never used smokeless tobacco. He reports that he does not drink alcohol or use drugs.   Review of Systems   Lipid history: LDL below 70, he has been on Lipitor 10 mg daily, not clear if he is taking this    Lab Results  Component Value Date   CHOL 124 09/11/2019   HDL 37.30 (L) 09/11/2019   LDLCALC 71 09/11/2019   TRIG 79.0 09/11/2019   CHOLHDL 3 09/11/2019           Hypertension:  Treated with taking 20 MG telmisartan by PCP along with HCTZ He checks blood pressures at home and the last reading was 140/80 + Blood pressure appears well controlled now   BP Readings from Last 3 Encounters:  09/14/19 126/82  03/08/19 124/72  04/20/18 124/80   Has had high potassium previously and this improved with stopping Invokana but is high again now He says that he is drinking a lot of Gatorade and eating bananas regularly No renal dysfunction  Lab Results  Component Value Date   K 5.4 (H) 09/11/2019    He does have regular eye exams, no retinopathy, last documented exam 1/21 with [facility]  Most recent foot  exam: 02/2019     Physical Examination:  BP 126/82 (BP Location: Left Arm, Patient Position: Sitting, Cuff Size: Normal)   Pulse 83   Ht 5' 9"  (1.753 m)   Wt 266 lb (120.7 kg)   SpO2 96%   BMI 39.28 kg/m   ASSESSMENT:  Diabetes type 2, with obesity, insulin requiring  See history of present illness for detailed discussion of current diabetes management, blood sugar patterns and problems identified  His A1c is about the same at 6.8  He is on regimen of twice a day Lantus insulin, metformin and Farxiga 5 mg  His A1c indicates overall good control He does not check his readings after meals as directed but is home readings are generally fairly good except a couple of sporadic high readings on 160-170 This is likely related to variability in carbohydrate intake or snacks He is trying to do some walking also For now we will continue same dose of insulin Emphasized the need to check readings after meals also He will be sent to the glucose meter, he wants to use up his current test strips at home  No side effects or renal dysfunction from [place]: Blood pressure is improved Not clear if his home monitor is accurate and he did not bring this for comparison. 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    Elayne Snare 09/14/2019, 8:41 AM   Note: This office note was prepared with Dragon voice recognition system technology. Any transcriptional errors that result from this process are unintentional.

## 2019-09-14 ENCOUNTER — Other Ambulatory Visit: Payer: Self-pay

## 2019-09-14 ENCOUNTER — Encounter: Payer: Self-pay | Admitting: Endocrinology

## 2019-09-14 ENCOUNTER — Ambulatory Visit (INDEPENDENT_AMBULATORY_CARE_PROVIDER_SITE_OTHER): Payer: Medicare HMO | Admitting: Endocrinology

## 2019-09-14 VITALS — BP 126/82 | HR 83 | Ht 69.0 in | Wt 266.0 lb

## 2019-09-14 DIAGNOSIS — E875 Hyperkalemia: Secondary | ICD-10-CM

## 2019-09-14 DIAGNOSIS — E1165 Type 2 diabetes mellitus with hyperglycemia: Secondary | ICD-10-CM

## 2019-09-14 DIAGNOSIS — Z794 Long term (current) use of insulin: Secondary | ICD-10-CM | POA: Diagnosis not present

## 2019-09-14 DIAGNOSIS — I10 Essential (primary) hypertension: Secondary | ICD-10-CM

## 2019-09-14 MED ORDER — ACCU-CHEK NANO SMARTVIEW W/DEVICE KIT: PACK | 0 refills | Status: DC

## 2019-09-14 NOTE — Patient Instructions (Signed)
Check blood sugars on waking up 3-4 days a week  Also check blood sugars about 2 hours after meals and do this after different meals by rotation  Recommended blood sugar levels on waking up are 90-130 and about 2 hours after meal is 130-160  Please bring your blood sugar monitor to each visit, thank you   

## 2019-09-17 ENCOUNTER — Ambulatory Visit (AMBULATORY_SURGERY_CENTER): Payer: Medicare HMO | Admitting: Gastroenterology

## 2019-09-17 ENCOUNTER — Encounter: Payer: Self-pay | Admitting: Gastroenterology

## 2019-09-17 ENCOUNTER — Other Ambulatory Visit: Payer: Self-pay

## 2019-09-17 VITALS — BP 131/77 | HR 60 | Temp 98.2°F | Resp 29 | Ht 69.0 in | Wt 265.0 lb

## 2019-09-17 DIAGNOSIS — Z1211 Encounter for screening for malignant neoplasm of colon: Secondary | ICD-10-CM | POA: Diagnosis not present

## 2019-09-17 DIAGNOSIS — D120 Benign neoplasm of cecum: Secondary | ICD-10-CM | POA: Diagnosis not present

## 2019-09-17 MED ORDER — SODIUM CHLORIDE 0.9 % IV SOLN: 500.0000 mL | Freq: Once | INTRAVENOUS | Status: DC

## 2019-09-17 NOTE — Progress Notes (Signed)
A/ox3, pleased with MAC, report to RN 

## 2019-09-17 NOTE — Op Note (Signed)
[facility] Patient Name: Nicholas Schroeder Procedure Date: 09/17/2019 9:38 AM MRN: [id number] Endoscopist: Mauri Pole , MD Age: 64 Referring MD:  Date of Birth: 02/11/1956 Gender: Male Account #: [id number] Procedure:                Colonoscopy Indications:              Screening for colorectal malignant neoplasm Medicines:                Monitored Anesthesia Care Procedure:                Pre-Anesthesia Assessment:                           - Prior to the procedure, a History and Physical                            was performed, and patient medications and                            allergies were reviewed. The patient's tolerance of                            previous anesthesia was also reviewed. The risks                            and benefits of the procedure and the sedation                            options and risks were discussed with the patient.                            All questions were answered, and informed consent                            was obtained. Prior Anticoagulants: The patient has                            taken no previous anticoagulant or antiplatelet                            agents. ASA Grade Assessment: II - A patient with                            mild systemic disease. After reviewing the risks                            and benefits, the patient was deemed in                            satisfactory condition to undergo the procedure.                           After obtaining informed consent, the colonoscope  was passed under direct vision. Throughout the                            procedure, the patient's blood pressure, pulse, and                            oxygen saturations were monitored continuously. The                            Colonoscope was introduced through the anus and                            advanced to the the cecum, identified by                            appendiceal orifice and  ileocecal valve. The                            colonoscopy was performed without difficulty. The                            patient tolerated the procedure well. The quality                            of the bowel preparation was excellent. The                            ileocecal valve, appendiceal orifice, and rectum                            were photographed. Scope In: 9:45:07 AM Scope Out: 9:58:04 AM Scope Withdrawal Time: 0 hours 9 minutes 57 seconds  Total Procedure Duration: 0 hours 12 minutes 57 seconds  Findings:                 The perianal and digital rectal examinations were                            normal.                           A 7 mm polyp was found in the cecum. The polyp was                            sessile. The polyp was removed with a cold snare.                            Resection and retrieval were complete.                           Non-bleeding internal hemorrhoids were found during                            retroflexion. The hemorrhoids were small. Complications:  No immediate complications. Estimated Blood Loss:     Estimated blood loss was minimal. Impression:               - One 7 mm polyp in the cecum, removed with a cold                            snare. Resected and retrieved.                           - Non-bleeding internal hemorrhoids. Recommendation:           - Patient has a contact number available for                            emergencies. The signs and symptoms of potential                            delayed complications were discussed with the                            patient. Return to normal activities tomorrow.                            Written discharge instructions were provided to the                            patient.                           - Resume previous diet.                           - Continue present medications.                           - Await pathology results.                           - Repeat  colonoscopy in 5-10 years for surveillance                            based on pathology results. Mauri Pole, MD 09/17/2019 10:02:19 AM This report has been signed electronically.

## 2019-09-17 NOTE — Progress Notes (Signed)
No problems noted in the recovery room. maw 

## 2019-09-17 NOTE — Progress Notes (Signed)
Pt's states no medical or surgical changes since previsit or office visit.  TEMP-LS  V/S-KA

## 2019-09-17 NOTE — Patient Instructions (Addendum)
YOU HAD AN ENDOSCOPIC PROCEDURE TODAY AT [facility]:   Refer to the procedure report that was given to you for any specific questions about what was found during the examination.  If the procedure report does not answer your questions, please call your gastroenterologist to clarify.  If you requested that your care partner not be given the details of your procedure findings, then the procedure report has been included in a sealed envelope for you to review at your convenience later.  YOU SHOULD EXPECT: Some feelings of bloating in the abdomen. Passage of more gas than usual.  Walking can help get rid of the air that was put into your GI tract during the procedure and reduce the bloating. If you had a lower endoscopy (such as a colonoscopy or flexible sigmoidoscopy) you may notice spotting of blood in your stool or on the toilet paper. If you underwent a bowel prep for your procedure, you may not have a normal bowel movement for a few days.  Please Note:  You might notice some irritation and congestion in your nose or some drainage.  This is from the oxygen used during your procedure.  There is no need for concern and it should clear up in a day or so.  SYMPTOMS TO REPORT IMMEDIATELY:   Following lower endoscopy (colonoscopy or flexible sigmoidoscopy):  Excessive amounts of blood in the stool  Significant tenderness or worsening of abdominal pains  Swelling of the abdomen that is new, acute  Fever of 100F or higher   For urgent or emergent issues, a gastroenterologist can be reached at any hour by calling [phone]. Do not use MyChart messaging for urgent concerns.    DIET:  We do recommend a small meal at first, but then you may proceed to your regular diet.  Drink plenty of fluids but you should avoid alcoholic beverages for 24 hours.  ACTIVITY:  You should plan to take it easy for the rest of today and you should NOT DRIVE or use heavy machinery until tomorrow (because  of the sedation medicines used during the test).    FOLLOW UP: Our staff will call the number listed on your records 48-72 hours following your procedure to check on you and address any questions or concerns that you may have regarding the information given to you following your procedure. If we do not reach you, we will leave a message.  We will attempt to reach you two times.  During this call, we will ask if you have developed any symptoms of COVID 19. If you develop any symptoms (ie: fever, flu-like symptoms, shortness of breath, cough etc.) before then, please call [phone].  If you test positive for Covid 19 in the 2 weeks post procedure, please call and report this information to [place].    If any biopsies were taken you will be contacted by phone or by letter within the next 1-3 weeks.  Please call us at [phone] if you have not heard about the biopsies in 3 weeks.    SIGNATURES/CONFIDENTIALITY: You and/or your care partner have signed paperwork which will be entered into your electronic medical record.  These signatures attest to the fact that that the information above on your After Visit Summary has been reviewed and is understood.  Full responsibility of the confidentiality of this discharge information lies with you and/or your care-partner.    Handouts were given to you on polyps and hemorrhoids. Your blood sugar was 91 in  the recovery room. You may resume your current medications today. Await biopsy results. Please call if any questions or concerns.

## 2019-09-17 NOTE — Progress Notes (Signed)
Called to room to assist during endoscopic procedure.  Patient ID and intended procedure confirmed with present staff. Received instructions for my participation in the procedure from the performing physician.  

## 2019-09-19 ENCOUNTER — Other Ambulatory Visit: Payer: Self-pay | Admitting: Endocrinology

## 2019-09-19 ENCOUNTER — Telehealth: Payer: Self-pay | Admitting: *Deleted

## 2019-09-19 NOTE — Telephone Encounter (Signed)
  Follow up Call-  Call back number 09/17/2019  Post procedure Call Back phone  # [phone]  Permission to leave phone message Yes  Some recent data might be hidden     Patient questions:  Do you have a fever, pain , or abdominal swelling? No. Pain Score  0 *  Have you tolerated food without any problems? Yes.    Have you been able to return to your normal activities? Yes.    Do you have any questions about your discharge instructions: Diet   No. Medications  No. Follow up visit  No.  Do you have questions or concerns about your Care? No.  Actions: * If pain score is 4 or above: No action needed, pain <4.  1. Have you developed a fever since your procedure? no  2.   Have you had an respiratory symptoms (SOB or cough) since your procedure? no  3.   Have you tested positive for COVID 19 since your procedure no  4.   Have you had any family members/close contacts diagnosed with the COVID 19 since your procedure?  no   If yes to any of these questions please route to Joylene John, RN and Erenest Rasher, RN

## 2019-09-20 ENCOUNTER — Other Ambulatory Visit: Payer: Self-pay

## 2019-09-20 ENCOUNTER — Encounter: Payer: Self-pay | Admitting: Gastroenterology

## 2019-09-20 MED ORDER — ACCU-CHEK GUIDE W/DEVICE KIT: PACK | 0 refills | Status: AC

## 2019-09-20 MED ORDER — ACCU-CHEK FASTCLIX LANCETS MISC: 3 refills | Status: DC

## 2019-09-20 MED ORDER — ACCU-CHEK GUIDE VI STRP: ORAL_STRIP | 3 refills | Status: DC

## 2019-09-20 NOTE — Telephone Encounter (Signed)
Accu chek nano is no longer available. Would you like to send the Guide Me?

## 2019-09-20 NOTE — Telephone Encounter (Signed)
Rx sent, and called pt and left detailed voicemail letting him know this has been done and the reason for doing so.

## 2019-09-20 NOTE — Telephone Encounter (Signed)
Accu-Chek guide is okay, please let him know it is new

## 2019-10-03 ENCOUNTER — Other Ambulatory Visit: Payer: Self-pay | Admitting: Endocrinology

## 2019-10-04 ENCOUNTER — Other Ambulatory Visit: Payer: Self-pay

## 2019-10-04 MED ORDER — ACCU-CHEK GUIDE VI STRP: ORAL_STRIP | 3 refills | Status: DC

## 2019-10-22 ENCOUNTER — Other Ambulatory Visit: Payer: Self-pay | Admitting: Endocrinology

## 2019-10-26 ENCOUNTER — Other Ambulatory Visit: Payer: Self-pay | Admitting: Endocrinology

## 2019-10-31 ENCOUNTER — Other Ambulatory Visit: Payer: Self-pay

## 2019-10-31 MED ORDER — HYDROCHLOROTHIAZIDE 12.5 MG PO CAPS: 12.5000 mg | ORAL_CAPSULE | Freq: Every day | ORAL | 0 refills | Status: DC

## 2019-11-14 ENCOUNTER — Other Ambulatory Visit: Payer: Self-pay | Admitting: Endocrinology

## 2019-12-11 ENCOUNTER — Other Ambulatory Visit: Payer: Self-pay

## 2019-12-11 ENCOUNTER — Other Ambulatory Visit (INDEPENDENT_AMBULATORY_CARE_PROVIDER_SITE_OTHER): Payer: Medicare HMO

## 2019-12-11 DIAGNOSIS — E1165 Type 2 diabetes mellitus with hyperglycemia: Secondary | ICD-10-CM | POA: Diagnosis not present

## 2019-12-11 DIAGNOSIS — Z794 Long term (current) use of insulin: Secondary | ICD-10-CM | POA: Diagnosis not present

## 2019-12-11 LAB — COMPREHENSIVE METABOLIC PANEL
ALT: 15 U/L (ref 0–53)
AST: 12 U/L (ref 0–37)
Albumin: 4.1 g/dL (ref 3.5–5.2)
Alkaline Phosphatase: 102 U/L (ref 39–117)
BUN: 20 mg/dL (ref 6–23)
CO2: 28 mEq/L (ref 19–32)
Calcium: 9.6 mg/dL (ref 8.4–10.5)
Chloride: 100 mEq/L (ref 96–112)
Creatinine, Ser: 1.44 mg/dL (ref 0.40–1.50)
GFR: 59.8 mL/min — ABNORMAL LOW (ref >60.00)
Glucose, Bld: 210 mg/dL — ABNORMAL HIGH (ref 70–99)
Potassium: 4.7 mEq/L (ref 3.5–5.1)
Sodium: 135 mEq/L (ref 135–145)
Total Bilirubin: 0.3 mg/dL (ref 0.2–1.2)
Total Protein: 7.1 g/dL (ref 6.0–8.3)

## 2019-12-11 LAB — HEMOGLOBIN A1C: Hgb A1c MFr Bld: 7.7 % — ABNORMAL HIGH (ref 4.6–6.5)

## 2019-12-14 ENCOUNTER — Ambulatory Visit (INDEPENDENT_AMBULATORY_CARE_PROVIDER_SITE_OTHER): Payer: Medicare HMO | Admitting: Endocrinology

## 2019-12-14 ENCOUNTER — Encounter: Payer: Self-pay | Admitting: Endocrinology

## 2019-12-14 ENCOUNTER — Other Ambulatory Visit: Payer: Self-pay | Admitting: Endocrinology

## 2019-12-14 ENCOUNTER — Other Ambulatory Visit: Payer: Self-pay

## 2019-12-14 VITALS — BP 130/72 | HR 93 | Ht 69.0 in | Wt 262.6 lb

## 2019-12-14 DIAGNOSIS — Z794 Long term (current) use of insulin: Secondary | ICD-10-CM | POA: Diagnosis not present

## 2019-12-14 DIAGNOSIS — I10 Essential (primary) hypertension: Secondary | ICD-10-CM

## 2019-12-14 DIAGNOSIS — N289 Disorder of kidney and ureter, unspecified: Secondary | ICD-10-CM

## 2019-12-14 DIAGNOSIS — E1165 Type 2 diabetes mellitus with hyperglycemia: Secondary | ICD-10-CM | POA: Diagnosis not present

## 2019-12-14 NOTE — Progress Notes (Signed)
Patient ID: Nicholas Schroeder, male   DOB: [birth year], 64 y.o.   MRN: [id number]            Reason for Appointment: Follow-up   Referring physician: Suzanna Obey   History of Present Illness:          Date of diagnosis of type 2 diabetes mellitus: ? 2008        Background history:   Patient is unclear about when he was diagnosed to have diabetes and how his diagnosis was made Has been on metformin since diagnosis but not clear if he took other diabetes medications He was however started on insulin when his blood sugars are markedly increased in 2011 He thinks he has been on Lantus insulin all along with minimal change in doses  Recent history:   INSULIN regimen is: Lantus  28 in a.m. and 28 hs p.m.       Non-insulin hypoglycemic drugs the patient is taking are: Metformin 2000 mg daily, Farxiga 5 mg daily  His A1c is 7.7 compared to 6.8  The previous range 6.4-7.4  Current management, blood sugar patterns and problems identified:  Not clear why his A1c is higher but his blood sugars are increasing significantly in the last 2 to 3 weeks  He says that his blood sugars are higher from going on chlorthalidone 25 instead of 12.5 HCTZ  Also he will sometimes get into sweets like cakes  Only rarely drinking regular soft  Most of his high readings are in the mornings although occasionally after dinner also; previous FASTING readings were consistently below 140  Because of the heat he has not been able to walk as much  Weight is slightly less  He is checking blood sugars 2-3 times a day  No side effects with [place] and Metformin  Renal function slightly worse  He does try to walk 4/7 although slowly, he mostly walks about a block a day and takes an hour to do this   Side effects from medications have been: None  Compliance with the medical regimen: Improved  Glucose monitoring:  done up to 2 times a day         Glucometer:  Accu-Chek Nano.       Blood Glucose  readings by time of day by review of monitor:   PRE-MEAL Fasting Lunch Dinner Bedtime Overall  Glucose range:  105-232      Mean/median:  160     152   POST-MEAL PC Breakfast PC Lunch PC Dinner  Glucose range:   120-211  119-241  Mean/median:   137  157    Previous data:  PRE-MEAL Fasting Lunch Dinner Bedtime Overall  Glucose range:  86-134  165  87-174    Mean/median:      143     Self-care: Diet: Avoiding excess sweets  .     Meal times are: Usually no breakfast, lunch: 12pm Dinner: 6-7                 Dietician visit, most recent: 07/02/16                Weight history:  Wt Readings from Last 3 Encounters:  12/14/19 (!) 262 lb 9.6 oz (119.1 kg)  09/17/19 265 lb (120.2 kg)  09/14/19 266 lb (120.7 kg)    Glycemic control:   Lab Results  Component Value Date   HGBA1C 7.7 (H) 12/11/2019   HGBA1C 6.8 (H) 09/11/2019   HGBA1C 6.7 (H) 06/11/2019  Lab Results  Component Value Date   MICROALBUR <0.7 09/11/2019   LDLCALC 71 09/11/2019   CREATININE 1.44 12/11/2019   Lab Results  Component Value Date   MICRALBCREAT 0.6 09/11/2019    Lab Results  Component Value Date   FRUCTOSAMINE 227 01/04/2017   FRUCTOSAMINE 238 11/02/2016    Other active problems discussed: See review of systems    Allergies as of 12/14/2019   No Known Allergies     Medication List       Accurate as of December 14, 2019  9:16 AM. If you have any questions, ask your nurse or doctor.        Accu-Chek FastClix Lancets Misc Use accu chek fastclix to check blood sugar three times daily.   Accu-Chek Guide test strip Generic drug: glucose blood Use Accu Chek test strips as instructed to check blood sugar three times daily.   Accu-Chek Guide w/Device Kit Use Accu Chek Guide to check blood sugar three times daily.   amLODipine 5 MG tablet Commonly known as: NORVASC Take by mouth.   aspirin 81 MG tablet Take 81 mg by mouth daily.   atorvastatin 10 MG tablet Commonly known as:  LIPITOR Take 10 mg by mouth daily.   chlorthalidone 25 MG tablet Commonly known as: HYGROTON Take 25 mg by mouth daily.   Droplet Pen Needles 31G X 5 MM Misc Generic drug: Insulin Pen Needle USE WITH INSULIN PEN AS DIRECTED   Farxiga 5 MG Tabs tablet Generic drug: dapagliflozin propanediol TAKE 1 TABLET EVERY DAY (REPLACES INVOKANA)   hydrochlorothiazide 12.5 MG capsule Commonly known as: MICROZIDE Take 1 capsule (12.5 mg total) by mouth daily.   Lantus SoloStar 100 UNIT/ML Solostar Pen Generic drug: insulin glargine INJECT  28 UNITS IN THE MORNING  AND  28 UNITS IN THE EVENING   metFORMIN 500 MG tablet Commonly known as: GLUCOPHAGE TAKE 2 TABLETS TWICE DAILY WITH MEALS   OVER THE COUNTER MEDICATION   telmisartan 20 MG tablet Commonly known as: MICARDIS TAKE 1 TABLET EVERY DAY       Allergies: No Known Allergies  Past Medical History:  Diagnosis Date  . Cataract    forming both per pt   . Diabetes mellitus, type II ([place])   . HLD (hyperlipidemia)   . HTN (hypertension)   . Obesity     Past Surgical History:  Procedure Laterality Date  . ABDOMINAL SURGERY      Family History  Problem Relation Age of Onset  . Diabetes Mother   . Diabetes Father   . Diabetes Brother   . Colon polyps Sister   . Esophageal cancer Neg Hx   . Rectal cancer Neg Hx   . Stomach cancer Neg Hx   . Colon cancer Neg Hx     Social History:  reports that he has never smoked. He has never used smokeless tobacco. He reports that he does not drink alcohol and does not use drugs.   Review of Systems   Lipid history: LDL below 70, he has been on Lipitor 10 mg daily, not clear if he is taking this    Lab Results  Component Value Date   CHOL 124 09/11/2019   HDL 37.30 (L) 09/11/2019   LDLCALC 71 09/11/2019   TRIG 79.0 09/11/2019   CHOLHDL 3 09/11/2019           Hypertension:  Treated with taking 20 MG telmisartan by PCP along with chlorthalidone now Apparently in June his  blood  pressure in the office was 734 systolic and he was switched from 12.5 HCTZ to chlorthalidone  He checks blood pressures at home he thinks his blood pressure usually about 130/70s  BP Readings from Last 3 Encounters:  12/14/19 (!) 130/72  09/17/19 131/77  09/14/19 126/82   Has had high potassium previously  RENAL dysfunction: This appears to be related to adding chlorthalidone to his [place]  Lab Results  Component Value Date   K 4.7 12/11/2019   Lab Results  Component Value Date   CREATININE 1.44 12/11/2019   CREATININE 1.18 09/11/2019   CREATININE 1.21 06/11/2019    He does have regular eye exams, no retinopathy, last documented exam 1/21 with [facility]  Most recent foot exam: 02/2019     Physical Examination:  BP (!) 130/72 (BP Location: Left Arm, Patient Position: Sitting, Cuff Size: Large)   Pulse 93   Ht 5' 9"  (1.753 m)   Wt (!) 262 lb 9.6 oz (119.1 kg)   SpO2 96%   BMI 38.78 kg/m   ASSESSMENT:  Diabetes type 2, with obesity, insulin requiring  See history of present illness for detailed discussion of current diabetes management, blood sugar patterns and problems identified  His A1c is higher than usual at 7.7  He is on regimen of twice a day Lantus insulin, metformin and Farxiga 5 mg Blood sugars are much higher than usual especially fasting Although he is sometimes going off his diet and eating sweets blood sugars are mostly higher with switching HCTZ to chlorthalidone Weight is slightly lower but may be related to more diuresis Has sporadic high readings after meals but average blood sugar in the morning is now 160   He appears to have renal dysfunction from adding 25 mg chlorthalidone to [place] with significant rise in creatinine  HYPERTENSION: Blood pressure is fairly good; it was low normal when seen last by his PCP  Since he has increase in his glucose and creatinine will have him go back to HCTZ 12.5 mg daily and follow-up in 6  weeks  Hyperkalemia: Improved with reducing high potassium foods     There are no Patient Instructions on file for this visit.    Elayne Snare 12/14/2019, 9:16 AM   Note: This office note was prepared with Dragon voice recognition system technology. Any transcriptional errors that result from this process are unintentional.

## 2019-12-14 NOTE — Patient Instructions (Addendum)
Take 12.5 HCTZ again not the 25mg  daily  Check blood sugars on waking up 3-4  days a week  Also check blood sugars about 2 hours after meals and do this after different meals by rotation  Recommended blood sugar levels on waking up are 90-130 and about 2 hours after meal is 130-160  Please bring your blood sugar monitor to each visit, thank you

## 2020-01-11 ENCOUNTER — Other Ambulatory Visit: Payer: Self-pay | Admitting: Endocrinology

## 2020-01-23 ENCOUNTER — Other Ambulatory Visit: Payer: Self-pay | Admitting: Endocrinology

## 2020-01-24 ENCOUNTER — Other Ambulatory Visit (INDEPENDENT_AMBULATORY_CARE_PROVIDER_SITE_OTHER): Payer: Medicare HMO

## 2020-01-24 ENCOUNTER — Other Ambulatory Visit: Payer: Self-pay

## 2020-01-24 DIAGNOSIS — Z794 Long term (current) use of insulin: Secondary | ICD-10-CM

## 2020-01-24 DIAGNOSIS — E1165 Type 2 diabetes mellitus with hyperglycemia: Secondary | ICD-10-CM

## 2020-01-24 LAB — BASIC METABOLIC PANEL
BUN: 22 mg/dL (ref 6–23)
CO2: 30 mEq/L (ref 19–32)
Calcium: 9.8 mg/dL (ref 8.4–10.5)
Chloride: 102 mEq/L (ref 96–112)
Creatinine, Ser: 1.39 mg/dL (ref 0.40–1.50)
GFR: 62.27 mL/min (ref >60.00)
Glucose, Bld: 141 mg/dL — ABNORMAL HIGH (ref 70–99)
Potassium: 4.9 mEq/L (ref 3.5–5.1)
Sodium: 138 mEq/L (ref 135–145)

## 2020-01-29 ENCOUNTER — Other Ambulatory Visit: Payer: Self-pay

## 2020-01-29 ENCOUNTER — Ambulatory Visit (INDEPENDENT_AMBULATORY_CARE_PROVIDER_SITE_OTHER): Payer: Medicare HMO | Admitting: Endocrinology

## 2020-01-29 ENCOUNTER — Encounter: Payer: Self-pay | Admitting: Endocrinology

## 2020-01-29 VITALS — BP 128/84 | HR 86 | Wt 262.0 lb

## 2020-01-29 DIAGNOSIS — N289 Disorder of kidney and ureter, unspecified: Secondary | ICD-10-CM

## 2020-01-29 DIAGNOSIS — E1165 Type 2 diabetes mellitus with hyperglycemia: Secondary | ICD-10-CM

## 2020-01-29 DIAGNOSIS — I10 Essential (primary) hypertension: Secondary | ICD-10-CM | POA: Diagnosis not present

## 2020-01-29 DIAGNOSIS — Z794 Long term (current) use of insulin: Secondary | ICD-10-CM

## 2020-01-29 MED ORDER — HYDROCHLOROTHIAZIDE 12.5 MG PO CAPS: 12.5000 mg | ORAL_CAPSULE | Freq: Every day | ORAL | 0 refills | Status: DC

## 2020-01-29 NOTE — Progress Notes (Signed)
Patient ID: Nicholas Schroeder, male   DOB: 29-Sep-1955, 64 y.o.   MRN: [id number]            Reason for Appointment: Follow-up   Referring physician: Suzanna Obey   History of Present Illness:          Date of diagnosis of type 2 diabetes mellitus: ? 2008        Background history:   Patient is unclear about when he was diagnosed to have diabetes and how his diagnosis was made Has been on metformin since diagnosis but not clear if he took other diabetes medications He was however started on insulin when his blood sugars are markedly increased in 2011 He thinks he has been on Lantus insulin all along with minimal change in doses  Recent history:   INSULIN regimen is: Lantus  28 in a.m. and 28 hs p.m.       Non-insulin hypoglycemic drugs the patient is taking are: Metformin 2000 mg daily, Farxiga 5 mg daily  His A1c is last 7.7 compared to 6.8  The previous range 6.4-7.4  Current management, blood sugar patterns and problems identified:  On his last visit he was continued on his medications unchanged  Also not clear whether he was having higher sugars from starting chlorthalidone in addition to HCTZ  Also renal function was relatively worse  His blood sugars are generally pretty good with average 143 compared to 152 previously  Has a few readings sporadically over 200 possibly from eating watermelon  No recent weight change   Side effects from medications have been: None  Compliance with the medical regimen: Improved  Glucose monitoring:  done up to 2 times a day         Glucometer:  Accu-Chek Nano.       Blood Glucose readings by time of day by download of monitor:   PRE-MEAL Fasting Lunch Dinner Bedtime Overall  Glucose range:  116-255  123-206     Mean/median:  150     143   POST-MEAL PC Breakfast PC Lunch PC Dinner  Glucose range:    98-211  Mean/median:    145   Previously  PRE-MEAL Fasting Lunch Dinner Bedtime Overall  Glucose range:  105-232        Mean/median:  160     152   POST-MEAL PC Breakfast PC Lunch PC Dinner  Glucose range:   120-211  119-241  Mean/median:   137  157      Self-care: Diet: Avoiding excess sweets  .     Meal times are: Usually no breakfast, lunch: 12pm Dinner: 6-7                 Dietician visit, most recent: 07/02/16                Weight history:  Wt Readings from Last 3 Encounters:  01/29/20 262 lb (118.8 kg)  12/14/19 (!) 262 lb 9.6 oz (119.1 kg)  09/17/19 265 lb (120.2 kg)    Glycemic control:   Lab Results  Component Value Date   HGBA1C 7.7 (H) 12/11/2019   HGBA1C 6.8 (H) 09/11/2019   HGBA1C 6.7 (H) 06/11/2019   Lab Results  Component Value Date   MICROALBUR <0.7 09/11/2019   LDLCALC 71 09/11/2019   CREATININE 1.39 01/24/2020   Lab Results  Component Value Date   MICRALBCREAT 0.6 09/11/2019    Lab Results  Component Value Date   FRUCTOSAMINE 227 01/04/2017   FRUCTOSAMINE  238 11/02/2016    Other active problems discussed: See review of systems    Allergies as of 01/29/2020   No Known Allergies     Medication List       Accurate as of January 29, 2020  9:42 AM. If you have any questions, ask your nurse or doctor.        Accu-Chek FastClix Lancets Misc Use accu chek fastclix to check blood sugar three times daily.   Accu-Chek Guide test strip Generic drug: glucose blood CHECK BLOOD SUGAR THREE TIMES DAILY   Accu-Chek Guide w/Device Kit Use Accu Chek Guide to check blood sugar three times daily.   amLODipine 5 MG tablet Commonly known as: NORVASC Take by mouth.   aspirin 81 MG tablet Take 81 mg by mouth daily.   atorvastatin 10 MG tablet Commonly known as: LIPITOR Take 10 mg by mouth daily.   chlorthalidone 25 MG tablet Commonly known as: HYGROTON Take 25 mg by mouth daily.   Droplet Pen Needles 31G X 5 MM Misc Generic drug: Insulin Pen Needle USE WITH INSULIN PEN AS DIRECTED   Farxiga 5 MG Tabs tablet Generic drug: dapagliflozin  propanediol TAKE 1 TABLET EVERY DAY (REPLACES INVOKANA)   hydrochlorothiazide 12.5 MG capsule Commonly known as: MICROZIDE Take 1 capsule (12.5 mg total) by mouth daily.   Lantus SoloStar 100 UNIT/ML Solostar Pen Generic drug: insulin glargine INJECT  28 UNITS IN THE MORNING  AND  28 UNITS IN THE EVENING   metFORMIN 500 MG tablet Commonly known as: GLUCOPHAGE TAKE 2 TABLETS TWICE DAILY WITH MEALS   OVER THE COUNTER MEDICATION   telmisartan 20 MG tablet Commonly known as: MICARDIS TAKE 1 TABLET EVERY DAY       Allergies: No Known Allergies  Past Medical History:  Diagnosis Date  . Cataract    forming both per pt   . Diabetes mellitus, type II ([place])   . HLD (hyperlipidemia)   . HTN (hypertension)   . Obesity     Past Surgical History:  Procedure Laterality Date  . ABDOMINAL SURGERY      Family History  Problem Relation Age of Onset  . Diabetes Mother   . Diabetes Father   . Diabetes Brother   . Colon polyps Sister   . Esophageal cancer Neg Hx   . Rectal cancer Neg Hx   . Stomach cancer Neg Hx   . Colon cancer Neg Hx     Social History:  reports that he has never smoked. He has never used smokeless tobacco. He reports that he does not drink alcohol and does not use drugs.   Review of Systems   Lipid history: LDL below 70, he has been on Lipitor 10 mg daily, not clear if he is taking this    Lab Results  Component Value Date   CHOL 124 09/11/2019   HDL 37.30 (L) 09/11/2019   LDLCALC 71 09/11/2019   TRIG 79.0 09/11/2019   CHOLHDL 3 09/11/2019           Hypertension:  Treated with taking 20 MG telmisartan by PCP Because of worsening of renal function he was told to stop chlorthalidone and stay on 12.5 mg HCTZ  However he says he did not have a prescription for HCTZ and is using a half of the 25 mg chlorthalidone  He checks blood pressures at home with a wrist instrument He thinks his blood pressure fluctuates between about 536-644  systolic Blood pressure checked twice today  BP  Readings from Last 3 Encounters:  01/29/20 128/84  12/14/19 (!) 130/72  09/17/19 131/77   Has had high potassium previously   RENAL dysfunction: Was possibly worse with adding chlorthalidone to his [place] Now this is slightly better  Lab Results  Component Value Date   K 4.9 01/24/2020   Lab Results  Component Value Date   CREATININE 1.39 01/24/2020   CREATININE 1.44 12/11/2019   CREATININE 1.18 09/11/2019    He does have regular eye exams, no retinopathy, last documented exam 1/21 with [facility]  Most recent foot exam: 02/2019  He is not sure when he had his Covid vaccines, likely in spring this year   Physical Examination:  BP 128/84   Pulse 86   Wt 262 lb (118.8 kg)   SpO2 96%   BMI 38.69 kg/m   ASSESSMENT:  Diabetes type 2, with obesity, insulin requiring  See history of present illness for detailed discussion of current diabetes management, blood sugar patterns and problems identified  His A1c is now 7.7  He is on regimen of twice a day Lantus insulin, metformin and Farxiga 5 mg No consistent pattern of high blood sugars seen some of his high readings regulated with a more carbohydrate of fruits like watermelon Also may have full efficacy of Farxiga because of his creatinine being slightly high again Stay on 2 g daily of metformin, current GFR 62  HYPERTENSION: Blood pressure is fairly good; relatively higher diastolic today checked twice Blood pressure is better at home but is using a wrist instrument  For now he will stay on the same regimen including amlodipine 5 mg, Micardis 20 mg and HCTZ 12.5 mg, new prescription will be sent again for HCTZ He will bring his monitor for comparison of accuracy for the blood pressure on the next visit  He will come for flu shot in about a month   There are no Patient Instructions on file for this visit.    Elayne Snare 01/29/2020, 9:42 AM   Note: This office  note was prepared with Dragon voice recognition system technology. Any transcriptional errors that result from this process are unintentional.

## 2020-01-31 ENCOUNTER — Telehealth: Payer: Self-pay

## 2020-01-31 NOTE — Telephone Encounter (Signed)
Received request from [facility] asking for clarification of Chlorthalidone and HCTZ. Letter has been sent via fax to [phone] providing clarification and medical rationale for these medications. Letter can be found in Epic.

## 2020-02-07 ENCOUNTER — Telehealth: Payer: Self-pay

## 2020-02-07 NOTE — Telephone Encounter (Signed)
New message    [facility] pharmacy need clarification on medication hydrochlorothiazide (MICROZIDE) 12.5 MG capsule   Another MD wrote stop HCTZ - given medication name Chlorthalidone 25 MG

## 2020-02-08 NOTE — Telephone Encounter (Signed)
Chlorthalidone has been stopped

## 2020-02-08 NOTE — Telephone Encounter (Signed)
[facility] pharmacy has been contacted and is aware of the Chlorthalidone being stopped

## 2020-03-19 ENCOUNTER — Other Ambulatory Visit: Payer: Self-pay | Admitting: Endocrinology

## 2020-04-02 ENCOUNTER — Other Ambulatory Visit: Payer: Self-pay | Admitting: Endocrinology

## 2020-04-03 ENCOUNTER — Other Ambulatory Visit (INDEPENDENT_AMBULATORY_CARE_PROVIDER_SITE_OTHER): Payer: Medicare HMO

## 2020-04-03 ENCOUNTER — Other Ambulatory Visit: Payer: Self-pay

## 2020-04-03 DIAGNOSIS — E1165 Type 2 diabetes mellitus with hyperglycemia: Secondary | ICD-10-CM | POA: Diagnosis not present

## 2020-04-03 DIAGNOSIS — Z794 Long term (current) use of insulin: Secondary | ICD-10-CM | POA: Diagnosis not present

## 2020-04-03 LAB — COMPREHENSIVE METABOLIC PANEL
ALT: 12 U/L (ref 0–53)
AST: 17 U/L (ref 0–37)
Albumin: 4.2 g/dL (ref 3.5–5.2)
Alkaline Phosphatase: 75 U/L (ref 39–117)
BUN: 16 mg/dL (ref 6–23)
CO2: 32 mEq/L (ref 19–32)
Calcium: 9.1 mg/dL (ref 8.4–10.5)
Chloride: 99 mEq/L (ref 96–112)
Creatinine, Ser: 1.37 mg/dL (ref 0.40–1.50)
GFR: 54.68 mL/min — ABNORMAL LOW (ref >60.00)
Glucose, Bld: 98 mg/dL (ref 70–99)
Potassium: 4.4 mEq/L (ref 3.5–5.1)
Sodium: 136 mEq/L (ref 135–145)
Total Bilirubin: 0.6 mg/dL (ref 0.2–1.2)
Total Protein: 7.1 g/dL (ref 6.0–8.3)

## 2020-04-03 LAB — HEMOGLOBIN A1C: Hgb A1c MFr Bld: 7.2 % — ABNORMAL HIGH (ref 4.6–6.5)

## 2020-04-04 ENCOUNTER — Other Ambulatory Visit: Payer: Self-pay | Admitting: Endocrinology

## 2020-04-07 ENCOUNTER — Other Ambulatory Visit: Payer: Self-pay

## 2020-04-07 ENCOUNTER — Ambulatory Visit (INDEPENDENT_AMBULATORY_CARE_PROVIDER_SITE_OTHER): Payer: Medicare HMO | Admitting: Endocrinology

## 2020-04-07 ENCOUNTER — Encounter: Payer: Self-pay | Admitting: Endocrinology

## 2020-04-07 VITALS — BP 118/82 | HR 95 | Ht 69.0 in | Wt 255.4 lb

## 2020-04-07 DIAGNOSIS — N289 Disorder of kidney and ureter, unspecified: Secondary | ICD-10-CM

## 2020-04-07 DIAGNOSIS — E1165 Type 2 diabetes mellitus with hyperglycemia: Secondary | ICD-10-CM

## 2020-04-07 DIAGNOSIS — I10 Essential (primary) hypertension: Secondary | ICD-10-CM

## 2020-04-07 DIAGNOSIS — Z794 Long term (current) use of insulin: Secondary | ICD-10-CM

## 2020-04-07 DIAGNOSIS — Z23 Encounter for immunization: Secondary | ICD-10-CM | POA: Diagnosis not present

## 2020-04-07 DIAGNOSIS — E782 Mixed hyperlipidemia: Secondary | ICD-10-CM

## 2020-04-07 NOTE — Progress Notes (Signed)
Patient ID: Nicholas Schroeder, male   DOB: 04-28-1956, 64 y.o.   MRN: [id number]            Reason for Appointment: Follow-up   Referring physician: Suzanna Obey   History of Present Illness:          Date of diagnosis of type 2 diabetes mellitus: ? 2008        Background history:   Patient is unclear about when he was diagnosed to have diabetes and how his diagnosis was made Has been on metformin since diagnosis but not clear if he took other diabetes medications He was however started on insulin when his blood sugars are markedly increased in 2011 He thinks he has been on Lantus insulin all along with minimal change in doses  Recent history:   INSULIN regimen is: Lantus  28 in a.m. and 28 hs p.m.       Non-insulin hypoglycemic drugs the patient is taking are: Metformin 2000 mg daily, Farxiga 5 mg daily  His A1c is 7.2, improved from 7.7, previous range 6.4-7.7  Current management, blood sugar patterns and problems identified:  His fasting blood sugars are much better than before and fairly consistent  He says that he is not eating sweets and desserts like cakes at bedtime now  Also has lost 7 pounds  He is trying to walk when he can at least 3 days a week  Continues to take his Lantus consistently twice a day  No side effects from [place] and renal function is stable  Again will have higher blood sugars sporadically around 200 after dinner but usually infrequently    No hypoglycemia also   Side effects from medications have been: None  Compliance with the medical regimen: Improved  Glucose monitoring:  done up to 2 times a day         Glucometer:  Accu-Chek Nano.       Blood Glucose readings and averages by time of day by download of monitor:   PRE-MEAL Fasting Lunch Dinner Bedtime Overall  Glucose range:  89-148    136-243   Mean/median:  121   158  148  135   POST-MEAL PC Breakfast PC Lunch PC Dinner  Glucose range:   94-173   Mean/median:       Previous readings:  PRE-MEAL Fasting Lunch Dinner Bedtime Overall  Glucose range:  116-255  123-206     Mean/median:  150     143   POST-MEAL PC Breakfast PC Lunch PC Dinner  Glucose range:    98-211  Mean/median:    145     Self-care: Diet: Avoiding excess sweets  .     Meal times are: Usually no breakfast, lunch: 12pm Dinner: 6-7                 Dietician visit, most recent: 07/02/16                Weight history:  Wt Readings from Last 3 Encounters:  04/07/20 255 lb 6.4 oz (115.8 kg)  01/29/20 262 lb (118.8 kg)  12/14/19 (!) 262 lb 9.6 oz (119.1 kg)    Glycemic control:   Lab Results  Component Value Date   HGBA1C 7.2 (H) 04/03/2020   HGBA1C 7.7 (H) 12/11/2019   HGBA1C 6.8 (H) 09/11/2019   Lab Results  Component Value Date   MICROALBUR <0.7 09/11/2019   LDLCALC 71 09/11/2019   CREATININE 1.37 04/03/2020   Lab Results  Component  Value Date   MICRALBCREAT 0.6 09/11/2019    Lab Results  Component Value Date   FRUCTOSAMINE 227 01/04/2017   FRUCTOSAMINE 238 11/02/2016    Other active problems discussed: See review of systems    Allergies as of 04/07/2020   No Known Allergies     Medication List       Accurate as of April 07, 2020 11:04 AM. If you have any questions, ask your nurse or doctor.        Accu-Chek FastClix Lancets Misc Use accu chek fastclix to check blood sugar three times daily.   Accu-Chek Guide test strip Generic drug: glucose blood CHECK BLOOD SUGAR THREE TIMES DAILY   Accu-Chek Guide w/Device Kit Use Accu Chek Guide to check blood sugar three times daily.   amLODipine 5 MG tablet Commonly known as: NORVASC Take by mouth.   aspirin 81 MG tablet Take 81 mg by mouth daily.   atorvastatin 10 MG tablet Commonly known as: LIPITOR Take 10 mg by mouth daily.   Droplet Pen Needles 31G X 5 MM Misc Generic drug: Insulin Pen Needle USE WITH INSULIN PEN AS DIRECTED   Farxiga 5 MG Tabs tablet Generic drug:  dapagliflozin propanediol TAKE 1 TABLET EVERY DAY (REPLACES INVOKANA)   hydrochlorothiazide 12.5 MG capsule Commonly known as: MICROZIDE TAKE 1 CAPSULE (12.5 MG TOTAL) BY MOUTH DAILY.   Lantus SoloStar 100 UNIT/ML Solostar Pen Generic drug: insulin glargine INJECT  28 UNITS IN THE MORNING  AND  28 UNITS IN THE EVENING   metFORMIN 500 MG tablet Commonly known as: GLUCOPHAGE TAKE 2 TABLETS TWICE DAILY WITH MEALS   OVER THE COUNTER MEDICATION   telmisartan 20 MG tablet Commonly known as: MICARDIS TAKE 1 TABLET EVERY DAY       Allergies: No Known Allergies  Past Medical History:  Diagnosis Date  . Cataract    forming both per pt   . Diabetes mellitus, type II ([place])   . HLD (hyperlipidemia)   . HTN (hypertension)   . Obesity     Past Surgical History:  Procedure Laterality Date  . ABDOMINAL SURGERY      Family History  Problem Relation Age of Onset  . Diabetes Mother   . Diabetes Father   . Diabetes Brother   . Colon polyps Sister   . Esophageal cancer Neg Hx   . Rectal cancer Neg Hx   . Stomach cancer Neg Hx   . Colon cancer Neg Hx     Social History:  reports that he has never smoked. He has never used smokeless tobacco. He reports that he does not drink alcohol and does not use drugs.   Review of Systems   Lipid history: LDL below 70, he has been on Lipitor 10 mg daily, not clear if he is taking this    Lab Results  Component Value Date   CHOL 124 09/11/2019   HDL 37.30 (L) 09/11/2019   LDLCALC 71 09/11/2019   TRIG 79.0 09/11/2019   CHOLHDL 3 09/11/2019           Hypertension:  Treated with taking 20 MG telmisartan by PCP and also hydrochlorothiazide 12.5 mg Renal function worse with 25 mg chlorthalidone  He checks blood pressures at home with a generic wrist instrument which was reading just a little higher diastolic compared to office reading  Home blood pressure has been ranging between about 163-845 systolic and near normal diastolic with  some variability  Blood pressure checked daily at home  BP Readings from Last 3 Encounters:  04/07/20 118/82  01/29/20 128/84  12/14/19 (!) 130/72   Has had high potassium previously   RENAL dysfunction: Was possibly worse with adding chlorthalidone to his [place] now relatively stable   Lab Results  Component Value Date   K 4.4 04/03/2020   Lab Results  Component Value Date   CREATININE 1.37 04/03/2020   CREATININE 1.39 01/24/2020   CREATININE 1.44 12/11/2019    He does have regular eye exams, no retinopathy, last documented exam 1/21 with [facility]  Most recent foot exam: 02/2019     Physical Examination:  BP 118/82   Pulse 95   Ht 5' 9"  (1.753 m)   Wt 255 lb 6.4 oz (115.8 kg)   SpO2 99%   BMI 37.72 kg/m   Home BP 128/86  ASSESSMENT:  Diabetes type 2, with obesity, insulin requiring  See history of present illness for detailed discussion of current diabetes management, blood sugar patterns and problems identified  His A1c is now 7.2 compared to 7.7  He is on regimen of twice a day Lantus insulin, metformin and Farxiga 5 mg  He has done better with his diet with cutting back on sweets and sweet fruits as well as being able to lose weight since his last visit Blood sugars are generally fairly well controlled with especially morning sugars now averaging only about 120 Average blood sugars in the afternoon and evenings about 150 without hypoglycemia also  Plan: No change in insulin doses or Farxiga, continue to stay active with walking as much as possible  HYPERTENSION: Blood pressure is fairly good; diastolic reading was better on the second measurement Blood pressure is not consistently high at home and his instrument appears to be reasonably close to the office reading  For now he will stay on the same regimen including amlodipine 5 mg, Micardis 20 mg and HCTZ 12.5 mg, new prescription recently sent again for HCTZ  Follow-up in 4 months    There  are no Patient Instructions on file for this visit.  Influenza vaccine given  Elayne Snare 04/07/2020, 11:04 AM   Note: This office note was prepared with Dragon voice recognition system technology. Any transcriptional errors that result from this process are unintentional.

## 2020-04-30 ENCOUNTER — Other Ambulatory Visit: Payer: Self-pay | Admitting: Endocrinology

## 2020-05-25 ENCOUNTER — Ambulatory Visit (HOSPITAL_COMMUNITY)
Admission: EM | Admit: 2020-05-25 | Discharge: 2020-05-25 | Disposition: A | Discharge status: Home / Self Care | Payer: Medicare HMO | Attending: Family Medicine | Admitting: Family Medicine

## 2020-05-25 ENCOUNTER — Encounter (HOSPITAL_COMMUNITY): Payer: Self-pay

## 2020-05-25 ENCOUNTER — Other Ambulatory Visit: Payer: Self-pay

## 2020-05-25 DIAGNOSIS — R52 Pain, unspecified: Secondary | ICD-10-CM | POA: Insufficient documentation

## 2020-05-25 DIAGNOSIS — J069 Acute upper respiratory infection, unspecified: Secondary | ICD-10-CM

## 2020-05-25 DIAGNOSIS — U071 COVID-19: Secondary | ICD-10-CM | POA: Diagnosis not present

## 2020-05-25 MED ORDER — PROMETHAZINE-DM 6.25-15 MG/5ML PO SYRP: 5.0000 mL | ORAL_SOLUTION | Freq: Four times a day (QID) | ORAL | 0 refills | Status: DC | PRN

## 2020-05-25 NOTE — ED Triage Notes (Signed)
Pt presents with cough, nasal congestion and body aches x 1 week. Nyquil gives relief. Denies fever, sob.

## 2020-05-25 NOTE — ED Provider Notes (Signed)
[facility]    CSN: [id number] Arrival date & time: 05/25/20  1025      History   Chief Complaint Chief Complaint  Patient presents with  . Cough  . Nasal Congestion  . Generalized Body Aches    HPI Nicholas Schroeder is a 65 y.o. male.   Here today with cough, congestion, body aches, headache for 1 week now. Denies known fever, chills, CP, SOB, abdominal pain, N/V/D. So far taking nyquil with good temporary relief. No known sick contacts, no hx of pulmonary dz.      Past Medical History:  Diagnosis Date  . Cataract    forming both per pt   . Diabetes mellitus, type II ([place])   . HLD (hyperlipidemia)   . HTN (hypertension)   . Obesity     Patient Active Problem List   Diagnosis Date Noted  . Flu vaccine need 04/07/2020  . Diabetes mellitus, type II ([place])   . HTN (hypertension)   . HLD (hyperlipidemia)   . Obesity     Past Surgical History:  Procedure Laterality Date  . ABDOMINAL SURGERY         Home Medications    Prior to Admission medications   Medication Sig Start Date End Date Taking? Authorizing Provider  promethazine-dextromethorphan (PROMETHAZINE-DM) 6.25-15 MG/5ML syrup Take 5 mLs by mouth 4 (four) times daily as needed for cough. 05/25/20  Yes Volney American, PA-C  Accu-Chek FastClix Lancets MISC Use accu chek fastclix to check blood sugar three times daily. 09/20/19   Elayne Snare, MD  ACCU-CHEK GUIDE test strip CHECK BLOOD SUGAR THREE TIMES DAILY 05/01/20   Elayne Snare, MD  amLODipine (NORVASC) 5 MG tablet Take by mouth. 07/06/19   [provider]  aspirin 81 MG tablet Take 81 mg by mouth daily.    [provider]  atorvastatin (LIPITOR) 10 MG tablet Take 10 mg by mouth daily.    [provider]  Blood Glucose Monitoring Suppl (ACCU-CHEK GUIDE) w/Device KIT Use Accu Chek Guide to check blood sugar three times daily. 09/20/19   Elayne Snare, MD  DROPLET PEN NEEDLES 31G X 5 MM MISC USE WITH INSULIN PEN AS  DIRECTED 03/20/20   Elayne Snare, MD  FARXIGA 5 MG TABS tablet TAKE 1 TABLET EVERY DAY (REPLACES INVOKANA) 01/24/20   Elayne Snare, MD  hydrochlorothiazide (MICROZIDE) 12.5 MG capsule TAKE 1 CAPSULE (12.5 MG TOTAL) BY MOUTH DAILY. 04/05/20   Elayne Snare, MD  LANTUS SOLOSTAR 100 UNIT/ML Solostar Pen INJECT  28 UNITS IN THE MORNING  AND  28 UNITS IN THE EVENING 04/03/20   Elayne Snare, MD  metFORMIN (GLUCOPHAGE) 500 MG tablet TAKE 2 TABLETS TWICE DAILY WITH MEALS 01/13/20   Elayne Snare, MD  OVER THE COUNTER MEDICATION     [provider]  telmisartan (MICARDIS) 20 MG tablet TAKE 1 TABLET EVERY DAY 04/03/20   Elayne Snare, MD    Family History Family History  Problem Relation Age of Onset  . Diabetes Mother   . Diabetes Father   . Diabetes Brother   . Colon polyps Sister   . Esophageal cancer Neg Hx   . Rectal cancer Neg Hx   . Stomach cancer Neg Hx   . Colon cancer Neg Hx     Social History Social History   Tobacco Use  . Smoking status: Never Smoker  . Smokeless tobacco: Never Used  Substance Use Topics  . Alcohol use: No  . Drug use: No  Allergies   Patient has no known allergies.   Review of Systems Review of Systems PER HPI   Physical Exam Triage Vital Signs ED Triage Vitals  Enc Vitals Group     BP 05/25/20 1103 123/77     Pulse Rate 05/25/20 1103 (!) 107     Resp 05/25/20 1103 19     Temp 05/25/20 1103 99 F (37.2 C)     Temp Source 05/25/20 1103 Oral     SpO2 05/25/20 1103 97 %     Weight --      Height --      Head Circumference --      Peak Flow --      Pain Score 05/25/20 1102 0     Pain Loc --      Pain Edu? --      Excl. in [place]? --    No data found.  Updated Vital Signs BP 123/77 (BP Location: Right Arm)   Pulse (!) 107   Temp 99 F (37.2 C) (Oral)   Resp 19   SpO2 97%   Visual Acuity Right Eye Distance:   Left Eye Distance:   Bilateral Distance:    Right Eye Near:   Left Eye Near:    Bilateral Near:     Physical  Exam Vitals and nursing note reviewed.  Constitutional:      Appearance: Normal appearance.  HENT:     Head: Atraumatic.     Right Ear: Tympanic membrane normal.     Left Ear: Tympanic membrane normal.     Nose: Nose normal.     Mouth/Throat:     Mouth: Mucous membranes are moist.     Pharynx: Oropharynx is clear. Posterior oropharyngeal erythema present.  Eyes:     Extraocular Movements: Extraocular movements intact.     Conjunctiva/sclera: Conjunctivae normal.  Cardiovascular:     Rate and Rhythm: Normal rate and regular rhythm.     Heart sounds: Normal heart sounds.  Pulmonary:     Effort: Pulmonary effort is normal. No respiratory distress.     Breath sounds: Normal breath sounds. No wheezing or rales.  Abdominal:     General: Bowel sounds are normal. There is no distension.     Palpations: Abdomen is soft.     Tenderness: There is no abdominal tenderness.  Musculoskeletal:        General: Normal range of motion.     Cervical back: Normal range of motion and neck supple.  Skin:    General: Skin is warm and dry.  Neurological:     General: No focal deficit present.     Mental Status: He is oriented to person, place, and time.  Psychiatric:        Mood and Affect: Mood normal.        Thought Content: Thought content normal.        Judgment: Judgment normal.      UC Treatments / Results  Labs (all labs ordered are listed, but only abnormal results are displayed) Labs Reviewed  SARS CORONAVIRUS 2 (TAT 6-24 HRS)    EKG   Radiology No results found.  Procedures Procedures (including critical care time)  Medications Ordered in UC Medications - No data to display  Initial Impression / Assessment and Plan / UC Course  I have reviewed the triage vital signs and the nursing notes.  Pertinent labs & imaging results that were available during my care of the patient were reviewed by me  and considered in my medical decision making (see chart for details).      Mild tachycardia but otherwise vitals reassuring, exam well appearing without concerning findings. COVID pcr pending, discussed isolation, phenergan DM, continued OTC medications and supportive home care. Return precautions reviewed.   Final Clinical Impressions(s) / UC Diagnoses   Final diagnoses:  Viral URI with cough  Generalized body aches   Discharge Instructions   None    ED Prescriptions    Medication Sig Dispense Auth. Provider   promethazine-dextromethorphan (PROMETHAZINE-DM) 6.25-15 MG/5ML syrup Take 5 mLs by mouth 4 (four) times daily as needed for cough. 100 mL Volney American, [place]     PDMP not reviewed this encounter.   Volney American, [place] 05/25/20 1202

## 2020-05-26 ENCOUNTER — Other Ambulatory Visit: Payer: Self-pay | Admitting: Endocrinology

## 2020-05-26 LAB — SARS CORONAVIRUS 2 (TAT 6-24 HRS): SARS Coronavirus 2: POSITIVE — AB

## 2020-06-16 ENCOUNTER — Other Ambulatory Visit: Payer: Self-pay | Admitting: Endocrinology

## 2020-06-19 ENCOUNTER — Other Ambulatory Visit: Payer: Self-pay | Admitting: Endocrinology

## 2020-08-04 ENCOUNTER — Other Ambulatory Visit: Payer: Medicare HMO

## 2020-08-11 ENCOUNTER — Ambulatory Visit: Payer: Medicare HMO | Admitting: Endocrinology

## 2020-08-28 ENCOUNTER — Other Ambulatory Visit: Payer: Self-pay

## 2020-08-28 ENCOUNTER — Other Ambulatory Visit (INDEPENDENT_AMBULATORY_CARE_PROVIDER_SITE_OTHER): Payer: Medicare HMO

## 2020-08-28 DIAGNOSIS — Z794 Long term (current) use of insulin: Secondary | ICD-10-CM

## 2020-08-28 DIAGNOSIS — E1165 Type 2 diabetes mellitus with hyperglycemia: Secondary | ICD-10-CM

## 2020-08-28 DIAGNOSIS — E782 Mixed hyperlipidemia: Secondary | ICD-10-CM | POA: Diagnosis not present

## 2020-08-28 LAB — COMPREHENSIVE METABOLIC PANEL
ALT: 8 U/L (ref 0–53)
AST: 12 U/L (ref 0–37)
Albumin: 4.2 g/dL (ref 3.5–5.2)
Alkaline Phosphatase: 72 U/L (ref 39–117)
BUN: 20 mg/dL (ref 6–23)
CO2: 31 mEq/L (ref 19–32)
Calcium: 9.7 mg/dL (ref 8.4–10.5)
Chloride: 102 mEq/L (ref 96–112)
Creatinine, Ser: 1.33 mg/dL (ref 0.40–1.50)
GFR: 56.5 mL/min — ABNORMAL LOW (ref >60.00)
Glucose, Bld: 138 mg/dL — ABNORMAL HIGH (ref 70–99)
Potassium: 5.1 mEq/L (ref 3.5–5.1)
Sodium: 140 mEq/L (ref 135–145)
Total Bilirubin: 0.5 mg/dL (ref 0.2–1.2)
Total Protein: 7.4 g/dL (ref 6.0–8.3)

## 2020-08-28 LAB — LIPID PANEL
Cholesterol: 124 mg/dL (ref 0–200)
HDL: 45.9 mg/dL (ref >39.00)
LDL Cholesterol: 66 mg/dL (ref 0–99)
NonHDL: 78.31
Total CHOL/HDL Ratio: 3
Triglycerides: 64 mg/dL (ref 0.0–149.0)
VLDL: 12.8 mg/dL (ref 0.0–40.0)

## 2020-08-28 LAB — MICROALBUMIN / CREATININE URINE RATIO
Creatinine,U: 70.2 mg/dL
Microalb Creat Ratio: 1 mg/g (ref 0.0–30.0)
Microalb, Ur: <0.7 mg/dL (ref 0.0–1.9)

## 2020-08-28 LAB — HEMOGLOBIN A1C: Hgb A1c MFr Bld: 7.3 % — ABNORMAL HIGH (ref 4.6–6.5)

## 2020-09-02 ENCOUNTER — Ambulatory Visit (INDEPENDENT_AMBULATORY_CARE_PROVIDER_SITE_OTHER): Payer: Medicare HMO | Admitting: Endocrinology

## 2020-09-02 ENCOUNTER — Other Ambulatory Visit: Payer: Self-pay

## 2020-09-02 ENCOUNTER — Encounter: Payer: Self-pay | Admitting: Endocrinology

## 2020-09-02 VITALS — BP 140/84 | HR 92 | Ht 69.0 in | Wt 266.4 lb

## 2020-09-02 DIAGNOSIS — E538 Deficiency of other specified B group vitamins: Secondary | ICD-10-CM

## 2020-09-02 DIAGNOSIS — E7800 Pure hypercholesterolemia, unspecified: Secondary | ICD-10-CM | POA: Diagnosis not present

## 2020-09-02 DIAGNOSIS — I10 Essential (primary) hypertension: Secondary | ICD-10-CM

## 2020-09-02 DIAGNOSIS — E1165 Type 2 diabetes mellitus with hyperglycemia: Secondary | ICD-10-CM | POA: Diagnosis not present

## 2020-09-02 DIAGNOSIS — Z794 Long term (current) use of insulin: Secondary | ICD-10-CM

## 2020-09-02 MED ORDER — DAPAGLIFLOZIN PROPANEDIOL 10 MG PO TABS: 10.0000 mg | ORAL_TABLET | Freq: Every day | ORAL | 1 refills | Status: DC

## 2020-09-02 NOTE — Patient Instructions (Addendum)
Take 2 of Farxiga 5mg  Next Rx is 10mg  in am daily   Check blood sugars on waking up 2-3 days a week  Also check blood sugars about 2 hours after meals and do this after different meals by rotation  Recommended blood sugar levels on waking up are 90-130 and about 2 hours after meal is 130-180  Please bring your blood sugar monitor to each visit, thank you  Walk daily

## 2020-09-02 NOTE — Progress Notes (Signed)
Patient ID: Nicholas Schroeder, male   DOB: 1956-01-19, 65 y.o.   MRN: [id number]            Reason for Appointment: Follow-up   Referring physician: Suzanna Obey   History of Present Illness:          Date of diagnosis of type 2 diabetes mellitus: ? 2008        Background history:   Patient is unclear about when he was diagnosed to have diabetes and how his diagnosis was made Has been on metformin since diagnosis but not clear if he took other diabetes medications He was however started on insulin when his blood sugars are markedly increased in 2011 He thinks he has been on Lantus insulin all along with minimal change in doses  Recent history:   INSULIN regimen is: Lantus  28 in a.m. and 28 hs p.m.       Non-insulin hypoglycemic drugs the patient is taking are: Metformin 2000 mg daily, Farxiga 5 mg daily  His A1c is about the same at 7.3 compared to 7.2  The previous range 6.4-7.7  Current management, blood sugar patterns and problems identified:  His blood sugars can fluctuate again but not consistently  Likely may have high readings occasionally when eating out  However has not been doing as much walking and has gained his weight back and more  Has not missed any doses of Farxiga or Lantus  Most of his high readings are either after lunch and occasionally after dinner and only rarely in the morning  Has had only 1 low normal reading of 64 after lunch   Side effects from medications have been: None  Glucose monitoring:  done up to 2 times a day         Glucometer:  Accu-Chek Nano.       Blood Glucose readings and averages by time of day by download of monitor:   PRE-MEAL Fasting Lunch Dinner Bedtime Overall  Glucose range: 103-194  113     Mean/median:  145    142   POST-MEAL PC Breakfast PC Lunch PC Dinner  Glucose range:     Mean/median:   156  140   Previously:  PRE-MEAL Fasting Lunch Dinner Bedtime Overall  Glucose range:  89-148    136-243    Mean/median:  121   158  148  135   POST-MEAL PC Breakfast PC Lunch PC Dinner  Glucose range:   94-173   Mean/median:        Self-care: Diet: Avoiding excess sweets  .     Meal times are: Usually no breakfast, lunch: 12pm Dinner: 6-7                 Dietician visit, most recent: 07/02/16                Weight history:  Wt Readings from Last 3 Encounters:  09/02/20 266 lb 6.4 oz (120.8 kg)  04/07/20 255 lb 6.4 oz (115.8 kg)  01/29/20 262 lb (118.8 kg)    Glycemic control:   Lab Results  Component Value Date   HGBA1C 7.3 (H) 08/28/2020   HGBA1C 7.2 (H) 04/03/2020   HGBA1C 7.7 (H) 12/11/2019   Lab Results  Component Value Date   MICROALBUR <0.7 08/28/2020   LDLCALC 66 08/28/2020   CREATININE 1.33 08/28/2020   Lab Results  Component Value Date   MICRALBCREAT 1.0 08/28/2020    Lab Results  Component Value Date  FRUCTOSAMINE 227 01/04/2017   FRUCTOSAMINE 238 11/02/2016    Other active problems discussed: See review of systems    Allergies as of 09/02/2020   No Known Allergies     Medication List       Accurate as of September 02, 2020 11:27 AM. If you have any questions, ask your nurse or doctor.        Accu-Chek FastClix Lancets Misc Use accu chek fastclix to check blood sugar three times daily.   Accu-Chek Guide test strip Generic drug: glucose blood CHECK BLOOD SUGAR THREE TIMES DAILY   Accu-Chek Guide w/Device Kit Use Accu Chek Guide to check blood sugar three times daily.   amLODipine 5 MG tablet Commonly known as: NORVASC Take by mouth.   aspirin 81 MG tablet Take 81 mg by mouth daily.   atorvastatin 10 MG tablet Commonly known as: LIPITOR Take 10 mg by mouth daily.   dapagliflozin propanediol 10 MG Tabs tablet Commonly known as: Farxiga Take 1 tablet (10 mg total) by mouth daily. What changed:   medication strength  See the new instructions. Changed by: Elayne Snare, MD   Droplet Pen Needles 31G X 5 MM Misc Generic drug:  Insulin Pen Needle USE WITH INSULIN PEN AS DIRECTED   hydrochlorothiazide 12.5 MG capsule Commonly known as: MICROZIDE TAKE 1 CAPSULE (12.5 MG TOTAL) BY MOUTH DAILY.   Lantus SoloStar 100 UNIT/ML Solostar Pen Generic drug: insulin glargine INJECT  28 UNITS IN THE MORNING  AND  28 UNITS IN THE EVENING   metFORMIN 500 MG tablet Commonly known as: GLUCOPHAGE TAKE 2 TABLETS TWICE DAILY WITH MEALS   OVER THE COUNTER MEDICATION   promethazine-dextromethorphan 6.25-15 MG/5ML syrup Commonly known as: PROMETHAZINE-DM Take 5 mLs by mouth 4 (four) times daily as needed for cough.   telmisartan 20 MG tablet Commonly known as: MICARDIS TAKE 1 TABLET EVERY DAY       Allergies: No Known Allergies  Past Medical History:  Diagnosis Date  . Cataract    forming both per pt   . Diabetes mellitus, type II ([place])   . HLD (hyperlipidemia)   . HTN (hypertension)   . Obesity     Past Surgical History:  Procedure Laterality Date  . ABDOMINAL SURGERY      Family History  Problem Relation Age of Onset  . Diabetes Mother   . Diabetes Father   . Diabetes Brother   . Colon polyps Sister   . Esophageal cancer Neg Hx   . Rectal cancer Neg Hx   . Stomach cancer Neg Hx   . Colon cancer Neg Hx     Social History:  reports that he has never smoked. He has never used smokeless tobacco. He reports that he does not drink alcohol and does not use drugs.   Review of Systems   Lipid history: LDL below 70, he has been on Lipitor 10 mg daily with adequate control    Lab Results  Component Value Date   CHOL 124 08/28/2020   HDL 45.90 08/28/2020   LDLCALC 66 08/28/2020   TRIG 64.0 08/28/2020   CHOLHDL 3 08/28/2020           Hypertension:  Treated with taking 20 MG telmisartan by PCP and also hydrochlorothiazide 12.5 mg Renal function previously was worse with 25 mg chlorthalidone  He checks blood pressures at home with a generic wrist instrument  Home blood pressure has been mostly  in the 681-157 systolic range  Blood pressure checked  regularly at home  BP Readings from Last 3 Encounters:  09/02/20 140/84  05/25/20 123/77  04/07/20 118/82   Has had high potassium previously   RENAL dysfunction: As below Was possibly worse with adding chlorthalidone to his [place] now relatively stable   Lab Results  Component Value Date   K 5.1 08/28/2020   Lab Results  Component Value Date   CREATININE 1.33 08/28/2020   CREATININE 1.37 04/03/2020   CREATININE 1.39 01/24/2020    He does have regular eye exams, no retinopathy, last documented exam 1/21 with [facility]  Most recent foot exam: 02/2019  He had a slightly low B12 level done by PCP and is now on supplements   Physical Examination:  BP 140/84   Pulse 92   Ht 5' 9"  (1.753 m)   Wt 266 lb 6.4 oz (120.8 kg)   SpO2 99%   BMI 39.34 kg/m     ASSESSMENT:  Diabetes type 2, with obesity, insulin requiring  See history of present illness for detailed discussion of current diabetes management, blood sugar patterns and problems identified  His A1c is stable at 7.3  He is on regimen of twice a day Lantus insulin, metformin and Farxiga 5 mg  Blood sugars are somewhat inconsistent although on an average within the target range except in the morning Has gained weight likely from inconsistent diet compared to last year.  Plan: 10 mg [place] daily If he has relatively low readings he can let us know and we will reduce his insulin Discussed blood sugar target ranges for fasting and after meals May also consider using freestyle libre, will discuss on the next visit  HYPERTENSION: Blood pressure is somewhat variable but better at home usually Microalbumin normal To continue same regimen  Hyperlipidemia: LDL well controlled with atorvastatin  Follow-up in 4 months    Patient Instructions  Take 2 of Farxiga 42m Next Rx is 127min am daily   Check blood sugars on waking up 2-3 days a week  Also  check blood sugars about 2 hours after meals and do this after different meals by rotation  Recommended blood sugar levels on waking up are 90-130 and about 2 hours after meal is 130-180  Please bring your blood sugar monitor to each visit, thank you  Walk daily     AjElayne Snare/19/2022, 11:27 AM   Note: This office note was prepared with Dragon voice recognition system technology. Any transcriptional errors that result from this process are unintentional.

## 2020-09-03 ENCOUNTER — Other Ambulatory Visit: Payer: Self-pay | Admitting: Endocrinology

## 2020-09-27 ENCOUNTER — Other Ambulatory Visit: Payer: Self-pay | Admitting: Endocrinology

## 2020-10-13 ENCOUNTER — Other Ambulatory Visit: Payer: Self-pay | Admitting: Endocrinology

## 2020-11-21 ENCOUNTER — Other Ambulatory Visit: Payer: Self-pay | Admitting: Endocrinology

## 2020-11-27 ENCOUNTER — Other Ambulatory Visit: Payer: Self-pay

## 2020-11-27 ENCOUNTER — Other Ambulatory Visit (INDEPENDENT_AMBULATORY_CARE_PROVIDER_SITE_OTHER): Payer: Medicare HMO

## 2020-11-27 DIAGNOSIS — Z794 Long term (current) use of insulin: Secondary | ICD-10-CM | POA: Diagnosis not present

## 2020-11-27 DIAGNOSIS — E1165 Type 2 diabetes mellitus with hyperglycemia: Secondary | ICD-10-CM | POA: Diagnosis not present

## 2020-11-27 LAB — BASIC METABOLIC PANEL
BUN: 17 mg/dL (ref 6–23)
CO2: 31 mEq/L (ref 19–32)
Calcium: 9.6 mg/dL (ref 8.4–10.5)
Chloride: 100 mEq/L (ref 96–112)
Creatinine, Ser: 1.26 mg/dL (ref 0.40–1.50)
GFR: 60.18 mL/min (ref >60.00)
Glucose, Bld: 122 mg/dL — ABNORMAL HIGH (ref 70–99)
Potassium: 4.8 mEq/L (ref 3.5–5.1)
Sodium: 139 mEq/L (ref 135–145)

## 2020-11-27 LAB — HEMOGLOBIN A1C: Hgb A1c MFr Bld: 7.5 % — ABNORMAL HIGH (ref 4.6–6.5)

## 2020-12-02 ENCOUNTER — Ambulatory Visit: Payer: Medicare HMO | Admitting: Endocrinology

## 2020-12-11 ENCOUNTER — Other Ambulatory Visit: Payer: Self-pay

## 2020-12-11 ENCOUNTER — Ambulatory Visit (INDEPENDENT_AMBULATORY_CARE_PROVIDER_SITE_OTHER): Payer: Medicare HMO | Admitting: Endocrinology

## 2020-12-11 VITALS — BP 132/82 | HR 85 | Ht 69.0 in | Wt 262.6 lb

## 2020-12-11 DIAGNOSIS — I10 Essential (primary) hypertension: Secondary | ICD-10-CM

## 2020-12-11 DIAGNOSIS — E7800 Pure hypercholesterolemia, unspecified: Secondary | ICD-10-CM | POA: Diagnosis not present

## 2020-12-11 DIAGNOSIS — E1165 Type 2 diabetes mellitus with hyperglycemia: Secondary | ICD-10-CM

## 2020-12-11 DIAGNOSIS — Z794 Long term (current) use of insulin: Secondary | ICD-10-CM

## 2020-12-11 NOTE — Progress Notes (Signed)
Patient ID: Nicholas Schroeder, male   DOB: 04-Dec-1955, 65 y.o.   MRN: [id number]            Reason for Appointment: Follow-up   Referring physician: Suzanna Obey   History of Present Illness:          Date of diagnosis of type 2 diabetes mellitus: ? 2008        Background history:   Patient is unclear about when he was diagnosed to have diabetes and how his diagnosis was made Has been on metformin since diagnosis but not clear if he took other diabetes medications He was however started on insulin when his blood sugars are markedly increased in 2011 He thinks he has been on Lantus insulin all along with minimal change in doses  Recent history:   INSULIN regimen is: Lantus  28 in a.m. and 28 hs p.m.       Non-insulin hypoglycemic drugs the patient is taking are: Metformin 2000 mg daily, Farxiga 10  mg daily  His A1c is about the same at 7.5  The previous range 6.4-7.7  Current management, blood sugar patterns and problems identified: His blood sugars are periodically higher at all different times especially late afternoon  This is despite his saying that he is usually not eating lunch meal but may be checking soon after eating peanut butter crackers or other carbohydrate snacks  He has started walking in the mornings more regularly lately  With this his weight has started coming back down but still above his previous baseline  He does not know what makes his blood sugars go up over 200  Has a few over 180 at all different times including in the mornings  However blood sugars are mostly well controlled after his evening meal and averaging about 150 despite not taking mealtime shots  The Wilder Glade was increased on his last visit  Also no hypoglycemia, lowest reading 76   Side effects from medications have been: None  Glucose monitoring:  done up to 2 times a day         Glucometer:  Accu-Chek Nano.       Blood Glucose readings and averages by time of day by download of  monitor:   PRE-MEAL Morning Midday Evening Bedtime Overall  Glucose range:     76-228  Mean/median: 140 144 152  145   Previously:  PRE-MEAL Fasting Lunch Dinner Bedtime Overall  Glucose range: 103-194  113     Mean/median:  145    142   POST-MEAL PC Breakfast PC Lunch PC Dinner  Glucose range:     Mean/median:   156  140    Self-care: Diet: Avoiding excess sweets, no regular soft drinks and sweet tea  .     Meal times are: Usually no breakfast, lunch: 12pm Dinner: 6-7                 Dietician visit, most recent: 07/02/16                Weight history:  Wt Readings from Last 3 Encounters:  12/11/20 262 lb 9.6 oz (119.1 kg)  09/02/20 266 lb 6.4 oz (120.8 kg)  04/07/20 255 lb 6.4 oz (115.8 kg)    Glycemic control:   Lab Results  Component Value Date   HGBA1C 7.5 (H) 11/27/2020   HGBA1C 7.3 (H) 08/28/2020   HGBA1C 7.2 (H) 04/03/2020   Lab Results  Component Value Date   MICROALBUR <0.7 08/28/2020  LDLCALC 66 08/28/2020   CREATININE 1.26 11/27/2020   Lab Results  Component Value Date   MICRALBCREAT 1.0 08/28/2020    Lab Results  Component Value Date   FRUCTOSAMINE 227 01/04/2017   FRUCTOSAMINE 238 11/02/2016    Other active problems discussed: See review of systems    Allergies as of 12/11/2020   No Known Allergies      Medication List        Accurate as of December 11, 2020  8:53 AM. If you have any questions, ask your nurse or doctor.          Accu-Chek FastClix Lancets Misc Use accu chek fastclix to check blood sugar three times daily.   Accu-Chek Guide test strip Generic drug: glucose blood CHECK BLOOD SUGAR THREE TIMES DAILY   Accu-Chek Guide w/Device Kit Use Accu Chek Guide to check blood sugar three times daily.   amLODipine 5 MG tablet Commonly known as: NORVASC Take by mouth.   aspirin 81 MG tablet Take 81 mg by mouth daily.   atorvastatin 10 MG tablet Commonly known as: LIPITOR Take 10 mg by mouth daily.    dapagliflozin propanediol 10 MG Tabs tablet Commonly known as: Farxiga Take 1 tablet (10 mg total) by mouth daily.   Droplet Pen Needles 31G X 5 MM Misc Generic drug: Insulin Pen Needle USE  WITH  INSULIN  PEN AS DIRECTED   hydrochlorothiazide 12.5 MG capsule Commonly known as: MICROZIDE TAKE 1 CAPSULE (12.5 MG TOTAL) BY MOUTH DAILY.   Lantus SoloStar 100 UNIT/ML Solostar Pen Generic drug: insulin glargine INJECT  28 UNITS IN THE MORNING  AND  28 UNITS IN THE EVENING   metFORMIN 500 MG tablet Commonly known as: GLUCOPHAGE TAKE 2 TABLETS TWICE DAILY WITH MEALS   OVER THE COUNTER MEDICATION   promethazine-dextromethorphan 6.25-15 MG/5ML syrup Commonly known as: PROMETHAZINE-DM Take 5 mLs by mouth 4 (four) times daily as needed for cough.   telmisartan 20 MG tablet Commonly known as: MICARDIS TAKE 1 TABLET EVERY DAY        Allergies: No Known Allergies  Past Medical History:  Diagnosis Date   Cataract    forming both per pt    Diabetes mellitus, type II ([place])    HLD (hyperlipidemia)    HTN (hypertension)    Obesity     Past Surgical History:  Procedure Laterality Date   ABDOMINAL SURGERY      Family History  Problem Relation Age of Onset   Diabetes Mother    Diabetes Father    Diabetes Brother    Colon polyps Sister    Esophageal cancer Neg Hx    Rectal cancer Neg Hx    Stomach cancer Neg Hx    Colon cancer Neg Hx     Social History:  reports that he has never smoked. He has never used smokeless tobacco. He reports that he does not drink alcohol and does not use drugs.   Review of Systems   Lipid history: LDL below 70, he has been on Lipitor 10 mg daily with adequate control    Lab Results  Component Value Date   CHOL 124 08/28/2020   HDL 45.90 08/28/2020   LDLCALC 66 08/28/2020   TRIG 64.0 08/28/2020   CHOLHDL 3 08/28/2020           Hypertension:  Treated with taking 20 MG telmisartan by PCP and also hydrochlorothiazide 12.5 mg Renal  function previously was worse with 25 mg chlorthalidone  He checks  blood pressures at home with a generic wrist instrument  Home blood pressure has been mostly in the 953 systolic range  Blood pressure checked regularly at home  BP Readings from Last 3 Encounters:  12/11/20 132/82  09/02/20 140/84  05/25/20 123/77   Has had high potassium previously   RENAL dysfunction: As below Was possibly worse with adding chlorthalidone to his Wilder [place]ade now relatively stable   Lab Results  Component Value Date   K 4.8 11/27/2020   Lab Results  Component Value Date   CREATININE 1.26 11/27/2020   CREATININE 1.33 08/28/2020   CREATININE 1.37 04/03/2020    He does have regular eye exams, no retinopathy, last documented exam 5/22 with [facility]  Most recent foot exam: 7/22  He had a slightly low B12 level done by PCP and is on supplements   Physical Examination:  BP 132/82   Pulse 85   Ht 5' 9" (1.753 m)   Wt 262 lb 9.6 oz (119.1 kg)   SpO2 99%   BMI 38.78 kg/m   Diabetic Foot Exam - Simple   Simple Foot Form Diabetic Foot exam was performed with the following findings: Yes   Visual Inspection No deformities, no ulcerations, no other skin breakdown bilaterally: Yes Sensation Testing Intact to touch and monofilament testing bilaterally: Yes Pulse Check Posterior Tibialis and Dorsalis pulse intact bilaterally: Yes Comments      ASSESSMENT:  Diabetes type 2, with obesity, insulin requiring  See history of present illness for detailed discussion of current diabetes management, blood sugar patterns and problems identified  His A1c is stable at 7.5  He is on regimen of twice a day Lantus insulin, metformin and Farxiga 10 mg  Blood sugars are averaging 145 at home and lower than expected for A1c  Although he has some readings over 180 at all times these are inconsistent and less in the last couple of weeks  Likely has some inconsistent diet or exercise regimen causing  blood sugar fluctuation  However he cannot pinpoint what makes his blood sugars go up   Since he has a large supply of Lantus he will continue this but consider switching to [place] on the next visit Discussed avoiding foods with high glycemic index, large portions of carbohydrates or fried food at meals  He will call if he has any tendency to hypoglycemia  No evidence of neuropathy or vascular disease on foot exam today  HYPERTENSION: Blood pressure is relatively well controlled No changes in medications needed, microalbumin normal in 4/22  Hyperlipidemia: Have lipids rechecked on the next visit  Follow-up in 4 months    There are no Patient Instructions on file for this visit.     Elayne Snare 12/11/2020, 8:53 AM   Note: This office note was prepared with Dragon voice recognition system technology. Any transcriptional errors that result from this process are unintentional.

## 2020-12-11 NOTE — Patient Instructions (Signed)
Check blood sugars on waking up 3-4 days a week  Also check blood sugars about 2 hours after meals and do this after different meals by rotation  Recommended blood sugar levels on waking up are 90-130 and about 2 hours after meal is 130-180  Please bring your blood sugar monitor to each visit, thank you  Walk daily when cool

## 2021-01-21 ENCOUNTER — Other Ambulatory Visit: Payer: Self-pay | Admitting: Endocrinology

## 2021-02-27 ENCOUNTER — Telehealth: Payer: Self-pay | Admitting: Endocrinology

## 2021-02-27 NOTE — Telephone Encounter (Signed)
[facility] calling to confirm if patient is no longer on Atorvastatin. And if not, why? Please call [facility] at [phone]

## 2021-03-02 NOTE — Telephone Encounter (Signed)
Called [facility] back. States they will reach out to patients PCP.

## 2021-04-13 ENCOUNTER — Other Ambulatory Visit: Payer: Self-pay

## 2021-04-13 ENCOUNTER — Other Ambulatory Visit (INDEPENDENT_AMBULATORY_CARE_PROVIDER_SITE_OTHER): Payer: Medicare HMO

## 2021-04-13 DIAGNOSIS — E1165 Type 2 diabetes mellitus with hyperglycemia: Secondary | ICD-10-CM | POA: Diagnosis not present

## 2021-04-13 DIAGNOSIS — Z794 Long term (current) use of insulin: Secondary | ICD-10-CM

## 2021-04-13 DIAGNOSIS — E7800 Pure hypercholesterolemia, unspecified: Secondary | ICD-10-CM

## 2021-04-13 LAB — BASIC METABOLIC PANEL
BUN: 23 mg/dL (ref 6–23)
CO2: 30 mEq/L (ref 19–32)
Calcium: 9.7 mg/dL (ref 8.4–10.5)
Chloride: 99 mEq/L (ref 96–112)
Creatinine, Ser: 1.34 mg/dL (ref 0.40–1.50)
GFR: 55.74 mL/min — ABNORMAL LOW (ref >60.00)
Glucose, Bld: 147 mg/dL — ABNORMAL HIGH (ref 70–99)
Potassium: 4.8 mEq/L (ref 3.5–5.1)
Sodium: 139 mEq/L (ref 135–145)

## 2021-04-13 LAB — LIPID PANEL
Cholesterol: 194 mg/dL (ref 0–200)
HDL: 51.1 mg/dL (ref >39.00)
LDL Cholesterol: 123 mg/dL — ABNORMAL HIGH (ref 0–99)
NonHDL: 142.51
Total CHOL/HDL Ratio: 4
Triglycerides: 96 mg/dL (ref 0.0–149.0)
VLDL: 19.2 mg/dL (ref 0.0–40.0)

## 2021-04-13 LAB — HEMOGLOBIN A1C: Hgb A1c MFr Bld: 7.5 % — ABNORMAL HIGH (ref 4.6–6.5)

## 2021-04-15 ENCOUNTER — Other Ambulatory Visit: Payer: Self-pay

## 2021-04-15 ENCOUNTER — Ambulatory Visit (INDEPENDENT_AMBULATORY_CARE_PROVIDER_SITE_OTHER): Payer: Medicare HMO | Admitting: Endocrinology

## 2021-04-15 ENCOUNTER — Encounter: Payer: Self-pay | Admitting: Endocrinology

## 2021-04-15 VITALS — BP 118/72 | HR 76 | Ht 69.0 in | Wt 265.8 lb

## 2021-04-15 DIAGNOSIS — E7800 Pure hypercholesterolemia, unspecified: Secondary | ICD-10-CM | POA: Diagnosis not present

## 2021-04-15 DIAGNOSIS — E1165 Type 2 diabetes mellitus with hyperglycemia: Secondary | ICD-10-CM

## 2021-04-15 DIAGNOSIS — Z23 Encounter for immunization: Secondary | ICD-10-CM | POA: Diagnosis not present

## 2021-04-15 DIAGNOSIS — Z794 Long term (current) use of insulin: Secondary | ICD-10-CM

## 2021-04-15 DIAGNOSIS — I10 Essential (primary) hypertension: Secondary | ICD-10-CM | POA: Diagnosis not present

## 2021-04-15 MED ORDER — ATORVASTATIN CALCIUM 10 MG PO TABS: 10.0000 mg | ORAL_TABLET | Freq: Every day | ORAL | 1 refills | Status: DC

## 2021-04-15 NOTE — Progress Notes (Signed)
Patient ID: Nicholas Schroeder, male   DOB: Feb 23, 1956, 65 y.o.   MRN: [id number]            Reason for Appointment: Follow-up   Referring physician: Suzanna Obey   History of Present Illness:          Date of diagnosis of type 2 diabetes mellitus: ? 2008        Background history:   Patient is unclear about when he was diagnosed to have diabetes and how his diagnosis was made Has been on metformin since diagnosis but not clear if he took other diabetes medications He was however started on insulin when his blood sugars are markedly increased in 2011 He thinks he has been on Lantus insulin all along with minimal change in doses  Recent history:   INSULIN regimen is: Lantus  28 in a.m. and 28 hs p.m.       Non-insulin hypoglycemic drugs the patient is taking are: Metformin 2000 mg daily, Farxiga 10  mg daily  His A1c is about the same at 7.5  The previous range 6.4-7.7  Current management, blood sugar patterns and problems identified: His blood sugars are variable before breakfast and around dinnertime with occasional high readings in the evenings  Probably has occasional readings over 200 after supper but not clear how late after eating he is checking Also likely by some morning readings are higher, possibly after breakfast He says he had difficulty reading his meter but generally able to do some Blood sugars are low normal at midday including a couple of readings in the 60s As before he does not eat any breakfast Weight is about the same He has not done much walking but occasionally will get on an exercise bike Also he thinks he is usually avoiding a lot of high fat foods or desserts Appears to be taking his Wilder Glade regularly   Side effects from medications have been: None  Glucose monitoring:  done up to 2 times a day         Glucometer:  Accu-Chek Nano.       Blood Glucose readings and averages by time of day by download of monitor:   PRE-MEAL Fasting Lunch Dinner  Bedtime Overall  Glucose range: 74-197 66+   66-231  Mean/median: 145 96 139 138 135   POST-MEAL PC Breakfast PC Lunch PC Dinner  Glucose range:     Mean/median:   153   Previously:  PRE-MEAL Morning Midday Evening Bedtime Overall  Glucose range:     76-228  Mean/median: 140 144 152  145       Self-care: Diet: Avoiding excess sweets, no regular soft drinks and sweet tea  .     Meal times are: Usually no breakfast, lunch: 12pm Dinner: 6-7                 Dietician visit, most recent: 07/02/16                Weight history:  Wt Readings from Last 3 Encounters:  04/15/21 265 lb 12.8 oz (120.6 kg)  12/11/20 262 lb 9.6 oz (119.1 kg)  09/02/20 266 lb 6.4 oz (120.8 kg)    Glycemic control:   Lab Results  Component Value Date   HGBA1C 7.5 (H) 04/13/2021   HGBA1C 7.5 (H) 11/27/2020   HGBA1C 7.3 (H) 08/28/2020   Lab Results  Component Value Date   MICROALBUR <0.7 08/28/2020   LDLCALC 123 (H) 04/13/2021   CREATININE 1.34  04/13/2021   Lab Results  Component Value Date   MICRALBCREAT 1.0 08/28/2020    Lab Results  Component Value Date   FRUCTOSAMINE 227 01/04/2017   FRUCTOSAMINE 238 11/02/2016    Other active problems discussed: See review of systems    Allergies as of 04/15/2021   No Known Allergies      Medication List        Accurate as of April 15, 2021  9:00 AM. If you have any questions, ask your nurse or doctor.          Accu-Chek FastClix Lancets Misc Use accu chek fastclix to check blood sugar three times daily.   Accu-Chek Guide test strip Generic drug: glucose blood CHECK BLOOD SUGAR THREE TIMES DAILY   Accu-Chek Guide w/Device Kit Use Accu Chek Guide to check blood sugar three times daily.   amLODipine 5 MG tablet Commonly known as: NORVASC Take by mouth.   aspirin 81 MG tablet Take 81 mg by mouth daily.   atorvastatin 10 MG tablet Commonly known as: LIPITOR Take 1 tablet (10 mg total) by mouth daily.   Droplet Pen  Needles 31G X 5 MM Misc Generic drug: Insulin Pen Needle USE  WITH  INSULIN  PEN AS DIRECTED   Farxiga 10 MG Tabs tablet Generic drug: dapagliflozin propanediol TAKE 1 TABLET EVERY DAY   hydrochlorothiazide 12.5 MG capsule Commonly known as: MICROZIDE TAKE 1 CAPSULE (12.5 MG TOTAL) BY MOUTH DAILY.   Lantus SoloStar 100 UNIT/ML Solostar Pen Generic drug: insulin glargine INJECT  28 UNITS IN THE MORNING  AND  28 UNITS IN THE EVENING   metFORMIN 500 MG tablet Commonly known as: GLUCOPHAGE TAKE 2 TABLETS TWICE DAILY WITH MEALS   OVER THE COUNTER MEDICATION   promethazine-dextromethorphan 6.25-15 MG/5ML syrup Commonly known as: PROMETHAZINE-DM Take 5 mLs by mouth 4 (four) times daily as needed for cough.   telmisartan 20 MG tablet Commonly known as: MICARDIS TAKE 1 TABLET EVERY DAY        Allergies: No Known Allergies  Past Medical History:  Diagnosis Date   Cataract    forming both per pt    Diabetes mellitus, type II ([place])    HLD (hyperlipidemia)    HTN (hypertension)    Obesity     Past Surgical History:  Procedure Laterality Date   ABDOMINAL SURGERY      Family History  Problem Relation Age of Onset   Diabetes Mother    Diabetes Father    Diabetes Brother    Colon polyps Sister    Esophageal cancer Neg Hx    Rectal cancer Neg Hx    Stomach cancer Neg Hx    Colon cancer Neg Hx     Social History:  reports that he has never smoked. He has never used smokeless tobacco. He reports that he does not drink alcohol and does not use drugs.   Review of Systems   Lipid history: LDL below 70, he has been on Lipitor 10 mg daily with previously good control Although he thinks he is getting his Lipitor do not see any prescriptions being sent and his LDL is significantly higher    Lab Results  Component Value Date   CHOL 194 04/13/2021   CHOL 124 08/28/2020   CHOL 124 09/11/2019   Lab Results  Component Value Date   HDL 51.10 04/13/2021   HDL 45.90  08/28/2020   HDL 37.30 (L) 09/11/2019   Lab Results  Component Value Date  LDLCALC 123 (H) 04/13/2021   LDLCALC 66 08/28/2020   LDLCALC 71 09/11/2019   Lab Results  Component Value Date   TRIG 96.0 04/13/2021   TRIG 64.0 08/28/2020   TRIG 79.0 09/11/2019   Lab Results  Component Value Date   CHOLHDL 4 04/13/2021   CHOLHDL 3 08/28/2020   CHOLHDL 3 09/11/2019   No results found for: LDLDIRECT          Hypertension:  Treated with taking 20 MG telmisartan by PCP and also hydrochlorothiazide 12.5 mg Renal function previously was worse with 25 mg chlorthalidone  He checks blood pressures at home with a generic wrist instrument  Blood pressure checked periodically at home  BP Readings from Last 3 Encounters:  04/15/21 118/72  12/11/20 132/82  09/02/20 140/84   Has had high potassium previously   RENAL dysfunction: Stable, creatinine history below Was possibly worse with adding chlorthalidone to his [place]ade now relatively stable   Lab Results  Component Value Date   K 4.8 04/13/2021   Lab Results  Component Value Date   CREATININE 1.34 04/13/2021   CREATININE 1.26 11/27/2020   CREATININE 1.33 08/28/2020    He does have regular eye exams, no retinopathy, last documented exam 5/22 with [facility]  Most recent foot exam: 7/22  He had a slightly low B12 level done by PCP and is on B12 supplements   Physical Examination:  BP 118/72   Pulse 76   Ht _0  (1.753 m)   Wt 265 lb 12.8 oz (120.6 kg)   SpO2 97%   BMI 39.25 kg/m     ASSESSMENT:  Diabetes type 2, with obesity, insulin requiring  See history of present illness for detailed discussion of current diabetes management, blood sugar patterns and problems identified  His A1c is stable at 7.5  He is on regimen of twice a day Lantus insulin, metformin and Farxiga 10 mg  Blood sugars are averaging 135 at home with monitoring at different times Has periodic high readings in the mornings especially  when checked later in the morning but also variably high readings in the evenings  However without any bolus insulin he still has fairly good control with no obvious postprandial hyperglycemia usually  However with not eating breakfast he tends to have low normal or slightly low readings around midday  Not doing much exercise lately  Discussed needing to have a snack midmorning to prevent low sugars and he prefers to have peanut butter crackers He will need to make sure he starts exercising regularly Continue same dose of Farxiga renal function is stable Discussed in detail the use of the freestyle libre sensor for more complete monitoring and alerts for high or low sugars and will see if this is covered by his insurance, patient information given  HYPERTENSION: Blood pressure is well controlled Will have microalbumin checked again on the next visit  Hyperlipidemia: New prescription for atorvastatin 10 mg has been sent Reminded him to take this regularly Have lipids rechecked on the next visit  Follow-up in 4 months    There are no Patient Instructions on file for this visit.  Flu vaccine given   Elayne Snare 04/15/2021, 9:00 AM   Note: This office note was prepared with Dragon voice recognition system technology. Any transcriptional errors that result from this process are unintentional.

## 2021-05-20 ENCOUNTER — Other Ambulatory Visit: Payer: Self-pay | Admitting: Endocrinology

## 2021-06-08 ENCOUNTER — Other Ambulatory Visit: Payer: Self-pay | Admitting: Endocrinology

## 2021-08-11 ENCOUNTER — Other Ambulatory Visit (INDEPENDENT_AMBULATORY_CARE_PROVIDER_SITE_OTHER): Payer: Medicare HMO

## 2021-08-11 ENCOUNTER — Other Ambulatory Visit: Payer: Self-pay

## 2021-08-11 DIAGNOSIS — Z794 Long term (current) use of insulin: Secondary | ICD-10-CM

## 2021-08-11 DIAGNOSIS — E7800 Pure hypercholesterolemia, unspecified: Secondary | ICD-10-CM

## 2021-08-11 DIAGNOSIS — E1165 Type 2 diabetes mellitus with hyperglycemia: Secondary | ICD-10-CM | POA: Diagnosis not present

## 2021-08-11 LAB — COMPREHENSIVE METABOLIC PANEL
ALT: 9 U/L (ref 0–53)
AST: 10 U/L (ref 0–37)
Albumin: 4.4 g/dL (ref 3.5–5.2)
Alkaline Phosphatase: 79 U/L (ref 39–117)
BUN: 19 mg/dL (ref 6–23)
CO2: 30 mEq/L (ref 19–32)
Calcium: 9.6 mg/dL (ref 8.4–10.5)
Chloride: 99 mEq/L (ref 96–112)
Creatinine, Ser: 1.31 mg/dL (ref 0.40–1.50)
GFR: 57.15 mL/min — ABNORMAL LOW (ref >60.00)
Glucose, Bld: 89 mg/dL (ref 70–99)
Potassium: 4.8 mEq/L (ref 3.5–5.1)
Sodium: 137 mEq/L (ref 135–145)
Total Bilirubin: 0.4 mg/dL (ref 0.2–1.2)
Total Protein: 6.9 g/dL (ref 6.0–8.3)

## 2021-08-11 LAB — LIPID PANEL
Cholesterol: 137 mg/dL (ref 0–200)
HDL: 42.4 mg/dL (ref >39.00)
LDL Cholesterol: 75 mg/dL (ref 0–99)
NonHDL: 94.29
Total CHOL/HDL Ratio: 3
Triglycerides: 98 mg/dL (ref 0.0–149.0)
VLDL: 19.6 mg/dL (ref 0.0–40.0)

## 2021-08-11 LAB — HM DIABETES EYE EXAM

## 2021-08-11 LAB — MICROALBUMIN / CREATININE URINE RATIO
Creatinine,U: 65.4 mg/dL
Microalb Creat Ratio: 1.1 mg/g (ref 0.0–30.0)
Microalb, Ur: <0.7 mg/dL (ref 0.0–1.9)

## 2021-08-11 LAB — HEMOGLOBIN A1C: Hgb A1c MFr Bld: 7.8 % — ABNORMAL HIGH (ref 4.6–6.5)

## 2021-08-12 NOTE — Progress Notes (Signed)
Patient ID: Nicholas Schroeder, male   DOB: [birth year], 66 y.o.   MRN: [id number]  ? ?       ? ? ?Reason for Appointment: Follow-up  ? ?Referring physician: Suzanna Obey ? ? ?History of Present Illness:  ?        ?Date of diagnosis of type 2 diabetes mellitus: ? 2008       ? ?Background history:  ? ?Patient is unclear about when he was diagnosed to have diabetes and how his diagnosis was made ?Has been on metformin since diagnosis but not clear if he took other diabetes medications ?He was however started on insulin when his blood sugars are markedly increased in 2011 ?He thinks he has been on Lantus insulin all along with minimal change in doses ? ?Recent history:  ? ?INSULIN regimen is: Lantus  28 in a.m. and 28 hs p.m.      ? ?Non-insulin hypoglycemic drugs the patient is taking are: Metformin 2000 mg daily, Farxiga 10  mg daily ? ?His A1c is about the same at 7.5 ? ?The previous range 6.4-7.7 ? ?Current management, blood sugar patterns and problems identified: ?His blood sugars are being monitored before breakfast and lunch and after dinner at home ?However he does not eat breakfast  ?He does have a few readings that are above target after dinner or fasting but not consistently  ?Unable to download his meter  ?He is now trying to get on his exercise bike for 30 minutes at least and he thinks he is doing this every other day  ?Weight is down 3 pounds  ?He thinks he is avoiding fried foods but still has difficulty losing adequate amount of weight ?No hypoglycemia reported ? ? ?Side effects from medications have been: None ? ?Glucose monitoring:  done up to 2 times a day         Glucometer:  Accu-Chek .    ?   ?Blood Glucose readings and averages by time of day by download of monitor: ? ? ?PRE-MEAL Fasting Lunch Dinner Bedtime Overall  ?Glucose range: 70-162 73-160     ?Mean/median:     141  ? ?POST-MEAL PC Breakfast PC Lunch PC Dinner  ?Glucose range:   133-226  ?Mean/median:     ? ?Previously ? ? ?PRE-MEAL Fasting  Lunch Dinner Bedtime Overall  ?Glucose range: 74-197 66+   66-231  ?Mean/median: 145 96 139 138 135  ? ?POST-MEAL PC Breakfast PC Lunch PC Dinner  ?Glucose range:     ?Mean/median:   153  ? ? ?Self-care: Diet: Avoiding excess sweets, no regular soft drinks and sweet tea ? Marland Kitchen     ?Meal times are: Usually no breakfast, lunch: 12pm Dinner: 6-7   ?              ?Dietician visit, most recent: 07/02/16 ?              ? ?Weight history: ? ?Wt Readings from Last 3 Encounters:  ?08/13/21 236 lb 12.8 oz (107.4 kg)  ?04/15/21 265 lb 12.8 oz (120.6 kg)  ?12/11/20 262 lb 9.6 oz (119.1 kg)  ? ? ?Glycemic control: ?  ?Lab Results  ?Component Value Date  ? HGBA1C 7.8 (H) 08/11/2021  ? HGBA1C 7.5 (H) 04/13/2021  ? HGBA1C 7.5 (H) 11/27/2020  ? ?Lab Results  ?Component Value Date  ? MICROALBUR <0.7 08/11/2021  ? [place] 75 08/11/2021  ? CREATININE 1.31 08/11/2021  ? ?Lab Results  ?Component Value Date  ?  MICRALBCREAT 1.1 08/11/2021  ? ? ?Lab Results  ?Component Value Date  ? FRUCTOSAMINE 227 01/04/2017  ? FRUCTOSAMINE 238 11/02/2016  ?  ?Other active problems discussed: See review of systems ? ? ? ?Allergies as of 08/13/2021   ?No Known Allergies ?  ? ?  ?Medication List  ?  ? ?  ? Accurate as of August 13, 2021  8:20 AM. If you have any questions, ask your nurse or doctor.  ?  ?  ? ?  ? ?Accu-Chek FastClix Lancets Misc ?Use accu chek fastclix to check blood sugar three times daily. ?  ?Accu-Chek Guide test strip ?Generic drug: glucose blood ?CHECK BLOOD SUGAR THREE TIMES DAILY ?  ?Accu-Chek Guide w/Device Kit ?Use Accu Chek Guide to check blood sugar three times daily. ?  ?amLODipine 5 MG tablet ?Commonly known as: NORVASC ?Take by mouth. ?  ?aspirin 81 MG tablet ?Take 81 mg by mouth daily. ?  ?atorvastatin 10 MG tablet ?Commonly known as: LIPITOR ?Take 1 tablet (10 mg total) by mouth daily. ?  ?Droplet Pen Needles 31G X 5 MM Misc ?Generic drug: Insulin Pen Needle ?USE  WITH  INSULIN  PEN AS DIRECTED ?  ?Farxiga 10 MG Tabs  tablet ?Generic drug: dapagliflozin propanediol ?TAKE 1 TABLET EVERY DAY ?  ?hydrochlorothiazide 12.5 MG capsule ?Commonly known as: MICROZIDE ?TAKE 1 CAPSULE (12.5 MG TOTAL) BY MOUTH DAILY. ?  ?Lantus SoloStar 100 UNIT/ML Solostar Pen ?Generic drug: insulin glargine ?INJECT  28 UNITS IN THE MORNING  AND  28 UNITS IN THE EVENING ?  ?metFORMIN 500 MG tablet ?Commonly known as: GLUCOPHAGE ?TAKE 2 TABLETS TWICE A DAY WITH MEALS ?  ?OVER THE COUNTER MEDICATION ?  ?promethazine-dextromethorphan 6.25-15 MG/5ML syrup ?Commonly known as: PROMETHAZINE-DM ?Take 5 mLs by mouth 4 (four) times daily as needed for cough. ?  ?telmisartan 20 MG tablet ?Commonly known as: MICARDIS ?TAKE 1 TABLET EVERY DAY ?  ? ?  ? ? ?Allergies: No Known Allergies ? ?Past Medical History:  ?Diagnosis Date  ? Cataract   ? forming both per pt   ? Diabetes mellitus, type II ([place])   ? HLD (hyperlipidemia)   ? HTN (hypertension)   ? Obesity   ? ? ?Past Surgical History:  ?Procedure Laterality Date  ? ABDOMINAL SURGERY    ? ? ?Family History  ?Problem Relation Age of Onset  ? Diabetes Mother   ? Diabetes Father   ? Diabetes Brother   ? Colon polyps Sister   ? Esophageal cancer Neg Hx   ? Rectal cancer Neg Hx   ? Stomach cancer Neg Hx   ? Colon cancer Neg Hx   ? ? ?Social History:  reports that he has never smoked. He has never used smokeless tobacco. He reports that he does not drink alcohol and does not use drugs. ? ? ?Review of Systems ? ? ?Lipid history: LDL below 70, he has been on Lipitor 10 mg daily with good control ?Was not taking this regularly as of 11/22 but now is back on it ?  ?Lab Results  ?Component Value Date  ? CHOL 137 08/11/2021  ? CHOL 194 04/13/2021  ? CHOL 124 08/28/2020  ? ?Lab Results  ?Component Value Date  ? HDL 42.40 08/11/2021  ? HDL 51.10 04/13/2021  ? HDL 45.90 08/28/2020  ? ?Lab Results  ?Component Value Date  ? [place] 75 08/11/2021  ? LDLCALC 123 (H) 04/13/2021  ? [place] 66 08/28/2020  ? ?Lab Results  ?Component Value  Date  ? TRIG 98.0 08/11/2021  ? TRIG 96.0 04/13/2021  ? TRIG 64.0 08/28/2020  ? ?Lab Results  ?Component Value Date  ? CHOLHDL 3 08/11/2021  ? CHOLHDL 4 04/13/2021  ? CHOLHDL 3 08/28/2020  ? ?No results found for: LDLDIRECT ?     ?    ?Hypertension:  ?Treated with taking 20 MG telmisartan by PCP and also hydrochlorothiazide 12.5 mg ?Renal function previously was worse with 25 mg chlorthalidone ? ?He checks blood pressures at home with a generic wrist instrument ? ? ?BP Readings from Last 3 Encounters:  ?08/13/21 126/62  ?04/15/21 118/72  ?12/11/20 132/82  ? ?Has had high potassium previously  ? ?RENAL dysfunction: Stable, creatinine history below ? ? ? ?Lab Results  ?Component Value Date  ? K 4.8 08/11/2021  ? ?Lab Results  ?Component Value Date  ? CREATININE 1.31 08/11/2021  ? CREATININE 1.34 04/13/2021  ? CREATININE 1.26 11/27/2020  ? ? ?He does have regular eye exams, no retinopathy, last documented exam 3/23 with [facility] ? ?Most recent foot exam: 7/22 ? ?He had a slightly low B12 level done by PCP and is on B12 supplements ? ? ?Physical Examination: ? ?BP 126/62   Pulse 78   Ht 5' 9"  (1.753 m)   Wt 236 lb 12.8 oz (107.4 kg)   SpO2 99%   BMI 34.97 kg/m?  ? ? ? ?ASSESSMENT: ? ?Diabetes type 2, with obesity, insulin requiring ? ?See history of present illness for detailed discussion of current diabetes management, blood sugar patterns and problems identified ? ?His A1c is 7.8 ? ?As before he is on regimen of twice a day Lantus insulin, metformin and Farxiga 10 mg ? ?Blood sugars are averaging about 140 but A1c is higher than expected ?He may be having some high postprandial readings which he does not monitor regularly including after lunch ?Still has some difficulty losing weight ?He may do better with continuous glucose monitoring to help adjust his diet better ?He will be given a prescription for Dexcom sensor since he was told freestyle Elenor [place]ato was not covered by mail order company ?Discussed how this  would work and help him adjust his diet and monitor his blood sugar more consistently ? ?Also would benefit from taking his Lantus before dinnertime instead of bedtime in the evening ? ?HYPERTENSION: Blood pressure consistently

## 2021-08-13 ENCOUNTER — Ambulatory Visit (INDEPENDENT_AMBULATORY_CARE_PROVIDER_SITE_OTHER): Payer: Medicare HMO | Admitting: Endocrinology

## 2021-08-13 VITALS — BP 126/62 | HR 78 | Ht 69.0 in | Wt 263.6 lb

## 2021-08-13 DIAGNOSIS — E7800 Pure hypercholesterolemia, unspecified: Secondary | ICD-10-CM | POA: Diagnosis not present

## 2021-08-13 DIAGNOSIS — I10 Essential (primary) hypertension: Secondary | ICD-10-CM

## 2021-08-13 DIAGNOSIS — E1165 Type 2 diabetes mellitus with hyperglycemia: Secondary | ICD-10-CM | POA: Diagnosis not present

## 2021-08-13 DIAGNOSIS — Z794 Long term (current) use of insulin: Secondary | ICD-10-CM | POA: Diagnosis not present

## 2021-08-13 MED ORDER — DEXCOM G6 RECEIVER DEVI: 0 refills | Status: DC

## 2021-08-13 MED ORDER — DEXCOM G6 TRANSMITTER MISC: 1.0000 | Freq: Once | 1 refills | Status: AC

## 2021-08-13 MED ORDER — DEXCOM G6 SENSOR MISC: 3 refills | Status: DC

## 2021-08-13 NOTE — Patient Instructions (Addendum)
Check blood sugars on waking up 3 days a week ? ?Also check blood sugars about 2 hours after meals and do this after different meals by rotation ? ?Recommended blood sugar levels on waking up are 90-130 and about 2 hours after meal is 130-160 ? ?Please bring your blood sugar monitor to each visit, thank you ? ?Take pm Insulin before supper ?

## 2021-09-14 ENCOUNTER — Other Ambulatory Visit: Payer: Self-pay | Admitting: Endocrinology

## 2021-09-28 ENCOUNTER — Other Ambulatory Visit: Payer: Self-pay | Admitting: Endocrinology

## 2021-11-10 ENCOUNTER — Other Ambulatory Visit (INDEPENDENT_AMBULATORY_CARE_PROVIDER_SITE_OTHER): Payer: Medicare HMO

## 2021-11-10 DIAGNOSIS — Z794 Long term (current) use of insulin: Secondary | ICD-10-CM | POA: Diagnosis not present

## 2021-11-10 DIAGNOSIS — E1165 Type 2 diabetes mellitus with hyperglycemia: Secondary | ICD-10-CM | POA: Diagnosis not present

## 2021-11-10 LAB — BASIC METABOLIC PANEL
BUN: 20 mg/dL (ref 6–23)
CO2: 32 mEq/L (ref 19–32)
Calcium: 9.6 mg/dL (ref 8.4–10.5)
Chloride: 103 mEq/L (ref 96–112)
Creatinine, Ser: 1.24 mg/dL (ref 0.40–1.50)
GFR: 60.93 mL/min (ref >60.00)
Glucose, Bld: 124 mg/dL — ABNORMAL HIGH (ref 70–99)
Potassium: 4.3 mEq/L (ref 3.5–5.1)
Sodium: 140 mEq/L (ref 135–145)

## 2021-11-10 LAB — HEMOGLOBIN A1C: Hgb A1c MFr Bld: 7.8 % — ABNORMAL HIGH (ref 4.6–6.5)

## 2021-11-13 ENCOUNTER — Encounter: Payer: Self-pay | Admitting: Endocrinology

## 2021-11-13 ENCOUNTER — Ambulatory Visit (INDEPENDENT_AMBULATORY_CARE_PROVIDER_SITE_OTHER): Payer: Medicare HMO | Admitting: Endocrinology

## 2021-11-13 VITALS — BP 118/82 | HR 89 | Ht 69.0 in | Wt 263.8 lb

## 2021-11-13 DIAGNOSIS — I10 Essential (primary) hypertension: Secondary | ICD-10-CM | POA: Diagnosis not present

## 2021-11-13 DIAGNOSIS — Z794 Long term (current) use of insulin: Secondary | ICD-10-CM | POA: Diagnosis not present

## 2021-11-13 DIAGNOSIS — E1165 Type 2 diabetes mellitus with hyperglycemia: Secondary | ICD-10-CM | POA: Diagnosis not present

## 2021-11-13 NOTE — Progress Notes (Signed)
Patient ID: Nicholas Schroeder, male   DOB: [birth year], 66 y.o.   MRN: [id number]            Reason for Appointment: Follow-up   Referring physician: Suzanna Obey   History of Present Illness:          Date of diagnosis of type 2 diabetes mellitus: ? 2008        Background history:   Patient is unclear about when he was diagnosed to have diabetes and how his diagnosis was made Has been on metformin since diagnosis but not clear if he took other diabetes medications He was however started on insulin when his blood sugars are markedly increased in 2011 He thinks he has been on Lantus insulin all along with minimal change in doses  Recent history:   INSULIN regimen is: Lantus  28 in a.m. and 28 p.m.       Non-insulin hypoglycemic drugs the patient is taking are: Metformin 2000 mg daily, Farxiga 10  mg daily  His A1c is about the same at 7.8  The previous range 6.4-7.7  Current management, blood sugar patterns and problems identified: His blood sugars are recently looking fairly good although his 30-day average is 160 Most of his fasting readings are fairly good including lab glucose of 124  As before he usually does not eat breakfast and blood sugars midday are usually fairly good except once  Also will have sporadic high readings over 200 after dinner but these are not being checked regularly now  He did not pursue the Dexcom sensor that was recommended on the last visit  He has had some difficulty being consistently active because of eye surgery Unable to download his meter  He is trying to get on his exercise bike for 60 minutes at least and he thinks he is doing this every other day  Weight is about the same He was recommended taking his LANTUS before dinner and he is trying to reduce except occasionally especially when not eating at home No hypoglycemia by symptoms   Side effects from medications have been: None  Glucose monitoring:  done up to 2 times a day          Glucometer:  Accu-Chek .       Blood Glucose readings and averages by time of day by download of monitor:   PRE-MEAL Fasting Lunch Dinner Bedtime Overall  Glucose range: 126-150 100-226     Mean/median:     160   POST-MEAL PC Breakfast PC Lunch PC Dinner  Glucose range:   122-229  Mean/median:      Previously  PRE-MEAL Fasting Lunch Dinner Bedtime Overall  Glucose range: 70-162 73-160     Mean/median:     141   POST-MEAL PC Breakfast PC Lunch PC Dinner  Glucose range:   133-226  Mean/median:        Self-care: Diet: Avoiding excess sweets, no regular soft drinks and sweet tea  .     Meal times are: Usually no breakfast, lunch: 12pm Dinner: 6-7                 Dietician visit, most recent: 07/02/16                Weight history:  Wt Readings from Last 3 Encounters:  11/13/21 263 lb 12.8 oz (119.7 kg)  08/13/21 263 lb 9.6 oz (119.6 kg)  04/15/21 265 lb 12.8 oz (120.6 kg)    Glycemic control:  Lab Results  Component Value Date   HGBA1C 7.8 (H) 11/10/2021   HGBA1C 7.8 (H) 08/11/2021   HGBA1C 7.5 (H) 04/13/2021   Lab Results  Component Value Date   MICROALBUR <0.7 08/11/2021   LDLCALC 75 08/11/2021   CREATININE 1.24 11/10/2021   Lab Results  Component Value Date   MICRALBCREAT 1.1 08/11/2021    Lab Results  Component Value Date   FRUCTOSAMINE 227 01/04/2017   FRUCTOSAMINE 238 11/02/2016    Other active problems discussed: See review of systems    Allergies as of 11/13/2021   No Known Allergies      Medication List        Accurate as of November 13, 2021 11:21 AM. If you have any questions, ask your nurse or doctor.          STOP taking these medications    promethazine-dextromethorphan 6.25-15 MG/5ML syrup Commonly known as: PROMETHAZINE-DM Stopped by: Elayne Snare, MD       TAKE these medications    Accu-Chek FastClix Lancets Misc Use accu chek fastclix to check blood sugar three times daily.   Accu-Chek Guide test strip Generic  drug: glucose blood CHECK BLOOD SUGAR THREE TIMES DAILY   Accu-Chek Guide w/Device Kit Use Accu Chek Guide to check blood sugar three times daily.   amLODipine 5 MG tablet Commonly known as: NORVASC Take by mouth.   aspirin 81 MG tablet Take 81 mg by mouth daily.   atorvastatin 10 MG tablet Commonly known as: LIPITOR TAKE 1 TABLET (10 MG TOTAL) BY MOUTH DAILY.   azaTHIOprine 50 MG tablet Commonly known as: IMURAN Take 100 mg by mouth daily.   chlorthalidone 25 MG tablet Commonly known as: HYGROTON Take 12.5 mg by mouth every morning.   Dexcom G6 Receiver Devi Use to monitor blood sugars   Dexcom G6 Sensor Misc Use to monitor blood sugar, change after 10 days   Droplet Pen Needles 31G X 5 MM Misc Generic drug: Insulin Pen Needle USE  WITH  INSULIN  PEN AS DIRECTED   Farxiga 10 MG Tabs tablet Generic drug: dapagliflozin propanediol TAKE 1 TABLET EVERY DAY   hydrochlorothiazide 12.5 MG capsule Commonly known as: MICROZIDE TAKE 1 CAPSULE (12.5 MG TOTAL) BY MOUTH DAILY.   ketorolac 0.5 % ophthalmic solution Commonly known as: ACULAR Place 1 drop into the left eye 4 (four) times daily.   Lantus SoloStar 100 UNIT/ML Solostar Pen Generic drug: insulin glargine INJECT  28 UNITS IN THE MORNING  AND  28 UNITS IN THE EVENING   metFORMIN 500 MG tablet Commonly known as: GLUCOPHAGE TAKE 2 TABLETS TWICE A DAY WITH MEALS   moxifloxacin 0.5 % ophthalmic solution Commonly known as: VIGAMOX Place 1 drop into the left eye 4 (four) times daily.   OVER THE COUNTER MEDICATION   telmisartan 20 MG tablet Commonly known as: MICARDIS TAKE 1 TABLET EVERY DAY        Allergies: No Known Allergies  Past Medical History:  Diagnosis Date   Cataract    forming both per pt    Diabetes mellitus, type II ([place])    HLD (hyperlipidemia)    HTN (hypertension)    Obesity     Past Surgical History:  Procedure Laterality Date   ABDOMINAL SURGERY      Family History  Problem  Relation Age of Onset   Diabetes Mother    Diabetes Father    Diabetes Brother    Colon polyps Sister    Esophageal cancer  Neg Hx    Rectal cancer Neg Hx    Stomach cancer Neg Hx    Colon cancer Neg Hx     Social History:  reports that he has never smoked. He has never used smokeless tobacco. He reports that he does not drink alcohol and does not use drugs.   Review of Systems   Lipid history: LDL below 70, he has been on Lipitor 10 mg daily with good control He has taken this regularly   Lab Results  Component Value Date   CHOL 137 08/11/2021   CHOL 194 04/13/2021   CHOL 124 08/28/2020   Lab Results  Component Value Date   HDL 42.40 08/11/2021   HDL 51.10 04/13/2021   HDL 45.90 08/28/2020   Lab Results  Component Value Date   LDLCALC 75 08/11/2021   LDLCALC 123 (H) 04/13/2021   LDLCALC 66 08/28/2020   Lab Results  Component Value Date   TRIG 98.0 08/11/2021   TRIG 96.0 04/13/2021   TRIG 64.0 08/28/2020   Lab Results  Component Value Date   CHOLHDL 3 08/11/2021   CHOLHDL 4 04/13/2021   CHOLHDL 3 08/28/2020   No results found for: "LDLDIRECT"          Hypertension:  Treated with taking 20 MG telmisartan by PCP and also hydrochlorothiazide 12.5 mg Renal function previously was worse with 25 mg chlorthalidone  He checks blood pressures at home with a generic wrist instrument   BP Readings from Last 3 Encounters:  11/13/21 118/82  08/13/21 126/62  04/15/21 118/72    RENAL dysfunction: Stable, creatinine history below Potassium more recently has been consistently normal  Lab Results  Component Value Date   K 4.3 11/10/2021   Lab Results  Component Value Date   CREATININE 1.24 11/10/2021   CREATININE 1.31 08/11/2021   CREATININE 1.34 04/13/2021    He does have regular eye exams, no retinopathy, [facility] documented exam 3/[facility] with [facility]  Most recent foot exam: 7/22  He had a slightly low B12 level done by PCP and is on B12  supplements   Physical Examination:  BP 118/82   Pulse 89   Ht 5' 9"  (1.753 m)   Wt 263 lb 12.8 oz (119.7 kg)   SpO2 95%   BMI 38.96 kg/m     ASSESSMENT:  Diabetes type 2, with obesity, insulin requiring  See history of present illness for detailed discussion of current diabetes management, blood sugar patterns and problems identified  His A1c is 7.8  Current treatment regimen: Lantus insulin twice daily, metformin and Farxiga 10 mg  Blood sugars are averaging about 160 Again A1c is higher than expected He may be having some high postprandial readings which he does not monitor regularly including after lunch Since there is no consistent pattern he will continue the same Lantus doses Unclear if he needs mealtime insulin because he does not apparently have consistently high readings after meals Reminded him to check more readings after dinner and discussed blood sugar targets  Also he can try to walk more regularly  He may not be able to use a CGM but hopefully once he has better vision may be able to try again  HYPERTENSION: Blood pressure consistently controlled Renal function also fairly good No hyperkalemia with his ARB drugs He can continue the same medications and also monitor periodically at home    Patient Instructions  Check blood sugars on waking up 3 days a week  Also check blood  sugars about 2 hours after meals and do this after different meals by rotation  Recommended blood sugar levels on waking up are 90-130 and about 2 hours after meal is 130-180  Please bring your blood sugar monitor to each visit, thank you      Elayne Snare 11/13/2021, 11:21 AM   Note: This office note was prepared with Dragon voice recognition system technology. Any transcriptional errors that result from this process are unintentional.

## 2021-11-13 NOTE — Patient Instructions (Addendum)
Check blood sugars on waking up 3 days a week ° °Also check blood sugars about 2 hours after meals and do this after different meals by rotation ° °Recommended blood sugar levels on waking up are 90-130 and about 2 hours after meal is 130-180 ° °Please bring your blood sugar monitor to each visit, thank you ° °

## 2022-01-06 ENCOUNTER — Other Ambulatory Visit: Payer: Self-pay | Admitting: Endocrinology

## 2022-01-12 LAB — HM DIABETES EYE EXAM

## 2022-01-25 ENCOUNTER — Other Ambulatory Visit: Payer: Self-pay | Admitting: Endocrinology

## 2022-03-15 ENCOUNTER — Other Ambulatory Visit (INDEPENDENT_AMBULATORY_CARE_PROVIDER_SITE_OTHER): Payer: Medicare HMO

## 2022-03-15 DIAGNOSIS — E1165 Type 2 diabetes mellitus with hyperglycemia: Secondary | ICD-10-CM

## 2022-03-15 DIAGNOSIS — Z794 Long term (current) use of insulin: Secondary | ICD-10-CM

## 2022-03-15 LAB — BASIC METABOLIC PANEL
BUN: 28 mg/dL — ABNORMAL HIGH (ref 6–23)
CO2: 29 mEq/L (ref 19–32)
Calcium: 9.9 mg/dL (ref 8.4–10.5)
Chloride: 101 mEq/L (ref 96–112)
Creatinine, Ser: 1.31 mg/dL (ref 0.40–1.50)
GFR: 56.91 mL/min — ABNORMAL LOW (ref >60.00)
Glucose, Bld: 96 mg/dL (ref 70–99)
Potassium: 4.8 mEq/L (ref 3.5–5.1)
Sodium: 138 mEq/L (ref 135–145)

## 2022-03-15 LAB — HEMOGLOBIN A1C: Hgb A1c MFr Bld: 8.5 % — ABNORMAL HIGH (ref 4.6–6.5)

## 2022-03-17 ENCOUNTER — Ambulatory Visit (INDEPENDENT_AMBULATORY_CARE_PROVIDER_SITE_OTHER): Payer: Medicare HMO | Admitting: Endocrinology

## 2022-03-17 VITALS — BP 152/90 | HR 68 | Ht 69.0 in | Wt 269.2 lb

## 2022-03-17 DIAGNOSIS — E1165 Type 2 diabetes mellitus with hyperglycemia: Secondary | ICD-10-CM

## 2022-03-17 DIAGNOSIS — Z23 Encounter for immunization: Secondary | ICD-10-CM | POA: Diagnosis not present

## 2022-03-17 DIAGNOSIS — I10 Essential (primary) hypertension: Secondary | ICD-10-CM | POA: Diagnosis not present

## 2022-03-17 DIAGNOSIS — Z794 Long term (current) use of insulin: Secondary | ICD-10-CM

## 2022-03-17 MED ORDER — SOLIQUA 100-33 UNT-MCG/ML ~~LOC~~ SOPN
PEN_INJECTOR | SUBCUTANEOUS | 1 refills | Status: DC
Start: 2022-03-17 — End: 2022-08-27

## 2022-03-17 NOTE — Progress Notes (Unsigned)
Patient ID: Nicholas Schroeder, male   DOB: 11-05-1955, 66 y.o.   MRN: [id number]            Reason for Appointment: Follow-up   Referring physician: Suzanna Obey   History of Present Illness:          Date of diagnosis of type 2 diabetes mellitus: ? 2008        Background history:   Patient is unclear about when he was diagnosed to have diabetes and how his diagnosis was made Has been on metformin since diagnosis but not clear if he took other diabetes medications He was however started on insulin when his blood sugars are markedly increased in 2011 He thinks he has been on Lantus insulin all along with minimal change in doses  Recent history:   INSULIN regimen is: Lantus  28 in a.m. and 28 p.m.       Non-insulin hypoglycemic drugs the patient is taking are: Metformin 2000 mg daily, Farxiga 10  mg daily  His A1c is 8.5 the same at 7.8  The previous range 6.4-7.7  Current management, blood sugar patterns and problems identified: His blood sugars are recently looking fairly good although his 30-day average is 160 Most of his fasting readings are fairly good including lab glucose of 124  As before he usually does not eat breakfast and blood sugars midday are usually fairly good except once  Also will have sporadic high readings over 200 after dinner but these are not being checked regularly now  He did not pursue the Dexcom sensor that was recommended on the last visit  He has had some difficulty being consistently active because of eye surgery Unable to download his meter  He is trying to get on his exercise bike for 60 minutes at least and he thinks he is doing this every other day  Weight is about the same He was recommended taking his LANTUS before dinner and he is trying to reduce except occasionally especially when not eating at home No hypoglycemia by symptoms   Side effects from medications have been: None  Glucose monitoring:  done up to 2 times a day          Glucometer:  Accu-Chek .       Blood Glucose readings and averages by time of day by download of monitor:   PRE-MEAL Fasting Lunch Dinner Bedtime Overall  Glucose range:       Mean/median:     163   POST-MEAL PC Breakfast PC Lunch PC Dinner  Glucose range:     Mean/median:         PRE-MEAL Fasting Lunch Dinner Bedtime Overall  Glucose range: 126-150 100-226     Mean/median:     160   POST-MEAL PC Breakfast PC Lunch PC Dinner  Glucose range:   122-229  Mean/median:         Self-care: Diet: Avoiding excess sweets, no regular soft drinks and sweet tea  .     Meal times are: Usually no breakfast, lunch: 12pm Dinner: 6-7                 Dietician visit, most recent: 07/02/16                Weight history:  Wt Readings from Last 3 Encounters:  03/17/22 269 lb 3.2 oz (122.1 kg)  11/13/21 263 lb 12.8 oz (119.7 kg)  08/13/21 263 lb 9.6 oz (119.6 kg)    Glycemic control:  Lab Results  Component Value Date   HGBA1C 8.5 (H) 03/15/2022   HGBA1C 7.8 (H) 11/10/2021   HGBA1C 7.8 (H) 08/11/2021   Lab Results  Component Value Date   MICROALBUR <0.7 08/11/2021   LDLCALC 75 08/11/2021   CREATININE 1.31 03/15/2022   Lab Results  Component Value Date   MICRALBCREAT 1.1 08/11/2021    Lab Results  Component Value Date   FRUCTOSAMINE 227 01/04/2017   FRUCTOSAMINE 238 11/02/2016    Other active problems discussed: See review of systems    Allergies as of 03/17/2022   No Known Allergies      Medication List        Accurate as of March 17, 2022 11:21 AM. If you have any questions, ask your nurse or doctor.          Accu-Chek FastClix Lancets Misc Use accu chek fastclix to check blood sugar three times daily.   Accu-Chek Guide test strip Generic drug: glucose blood TEST BLOOD SUGAR THREE TIMES DAILY   Accu-Chek Guide w/Device Kit Use Accu Chek Guide to check blood sugar three times daily.   amLODipine 5 MG tablet Commonly known as: NORVASC Take by  mouth.   aspirin 81 MG tablet Take 81 mg by mouth daily.   atorvastatin 10 MG tablet Commonly known as: LIPITOR TAKE 1 TABLET (10 MG TOTAL) BY MOUTH DAILY.   azaTHIOprine 50 MG tablet Commonly known as: IMURAN Take 100 mg by mouth daily.   chlorthalidone 25 MG tablet Commonly known as: HYGROTON Take 12.5 mg by mouth every morning.   Dexcom G6 Receiver Devi Use to monitor blood sugars   Dexcom G6 Sensor Misc Use to monitor blood sugar, change after 10 days   Droplet Pen Needles 31G X 5 MM Misc Generic drug: Insulin Pen Needle USE  WITH  INSULIN  PEN AS DIRECTED   Farxiga 10 MG Tabs tablet Generic drug: dapagliflozin propanediol TAKE 1 TABLET EVERY DAY   hydrochlorothiazide 12.5 MG capsule Commonly known as: MICROZIDE TAKE 1 CAPSULE (12.5 MG TOTAL) BY MOUTH DAILY.   ketorolac 0.5 % ophthalmic solution Commonly known as: ACULAR Place 1 drop into the left eye 4 (four) times daily.   Lantus SoloStar 100 UNIT/ML Solostar Pen Generic drug: insulin glargine INJECT  28 UNITS IN THE MORNING  AND  28 UNITS IN THE EVENING   metFORMIN 500 MG tablet Commonly known as: GLUCOPHAGE TAKE 2 TABLETS TWICE A DAY WITH MEALS   moxifloxacin 0.5 % ophthalmic solution Commonly known as: VIGAMOX Place 1 drop into the left eye 4 (four) times daily.   OVER THE COUNTER MEDICATION   telmisartan 20 MG tablet Commonly known as: MICARDIS TAKE 1 TABLET EVERY DAY        Allergies: No Known Allergies  Past Medical History:  Diagnosis Date   Cataract    forming both per pt    Diabetes mellitus, type II ([place])    HLD (hyperlipidemia)    HTN (hypertension)    Obesity     Past Surgical History:  Procedure Laterality Date   ABDOMINAL SURGERY      Family History  Problem Relation Age of Onset   Diabetes Mother    Diabetes Father    Diabetes Brother    Colon polyps Sister    Esophageal cancer Neg Hx    Rectal cancer Neg Hx    Stomach cancer Neg Hx    Colon cancer Neg Hx      Social History:  reports that  he has never smoked. He has never used smokeless tobacco. He reports that he does not drink alcohol and does not use drugs.   Review of Systems   Lipid history: LDL below 70, he has been on Lipitor 10 mg daily with good control He has taken this regularly   Lab Results  Component Value Date   CHOL 137 08/11/2021   CHOL 194 04/13/2021   CHOL 124 08/28/2020   Lab Results  Component Value Date   HDL 42.40 08/11/2021   HDL 51.10 04/13/2021   HDL 45.90 08/28/2020   Lab Results  Component Value Date   LDLCALC 75 08/11/2021   LDLCALC 123 (H) 04/13/2021   LDLCALC 66 08/28/2020   Lab Results  Component Value Date   TRIG 98.0 08/11/2021   TRIG 96.0 04/13/2021   TRIG 64.0 08/28/2020   Lab Results  Component Value Date   CHOLHDL 3 08/11/2021   CHOLHDL 4 04/13/2021   CHOLHDL 3 08/28/2020   No results found for: "LDLDIRECT"          Hypertension:  Treated with taking 20 MG telmisartan by PCP and also hydrochlorothiazide 12.5 mg Renal function previously was worse with 25 mg chlorthalidone  He checks blood pressures at home with a generic wrist instrument 122/74  BP Readings from Last 3 Encounters:  03/17/22 (!) 152/90  11/13/21 118/82  08/13/21 126/62    RENAL dysfunction: Stable, creatinine history below Potassium more recently has been consistently normal  Lab Results  Component Value Date   K 4.8 03/15/2022   Lab Results  Component Value Date   CREATININE 1.31 03/15/2022   CREATININE 1.24 11/10/2021   CREATININE 1.31 08/11/2021    He does have regular eye exams, no retinopathy, last documented exam 3/23 with [facility]  Most recent foot exam: 7/22  He had a slightly low B12 level done by PCP and is on B12 supplements   Physical Examination:  BP (!) 152/90 (BP Location: Left Arm, Patient Position: Sitting, Cuff Size: Normal)   Pulse 68   Ht _0  (1.753 m)   Wt 269 lb 3.2 oz (122.1 kg)   SpO2 95%   BMI 39.75  kg/m     ASSESSMENT:  Diabetes type 2, with obesity, insulin requiring  See history of present illness for detailed discussion of current diabetes management, blood sugar patterns and problems identified  His A1c is 7.8  Current treatment regimen: Lantus insulin twice daily, metformin and Farxiga 10 mg  Blood sugars are averaging about 160 Again A1c is higher than expected He may be having some high postprandial readings which he does not monitor regularly including after lunch Since there is no consistent pattern he will continue the same Lantus doses Unclear if he needs mealtime insulin because he does not apparently have consistently high readings after meals Reminded him to check more readings after dinner and discussed blood sugar targets  Also he can try to walk more regularly  He may not be able to use a CGM   HYPERTENSION: Blood pressure consistently controlled Renal function also fairly good No hyperkalemia with his ARB drugs He can continue the same medications and also monitor periodically at home    There are no Patient Instructions on file for this visit.      Elayne Snare 03/17/2022, 11:21 AM   Note: This office note was prepared with Dragon voice recognition system technology. Any transcriptional errors that result from this process are unintentional.

## 2022-03-17 NOTE — Patient Instructions (Addendum)
SOLIQUA: Will replace Lantus insulin in the morning  Start with 15 units before breakfast and every 2 days increase the dose by 2 units or 2 clicks  Stop increasing the dose when the blood sugar BEFORE dinnertime is below 120 Otherwise continue to increase the dose to a maximum of 30 units  If your MORNING blood sugars start getting below 90 reduce nighttime LANTUS by 2 units

## 2022-03-22 ENCOUNTER — Other Ambulatory Visit: Payer: Medicare HMO

## 2022-03-24 ENCOUNTER — Ambulatory Visit: Payer: Medicare HMO | Admitting: Endocrinology

## 2022-05-03 ENCOUNTER — Other Ambulatory Visit: Payer: Self-pay | Admitting: Endocrinology

## 2022-06-08 ENCOUNTER — Other Ambulatory Visit (INDEPENDENT_AMBULATORY_CARE_PROVIDER_SITE_OTHER): Payer: Medicare HMO

## 2022-06-08 DIAGNOSIS — Z794 Long term (current) use of insulin: Secondary | ICD-10-CM

## 2022-06-08 DIAGNOSIS — E1165 Type 2 diabetes mellitus with hyperglycemia: Secondary | ICD-10-CM | POA: Diagnosis not present

## 2022-06-08 LAB — BASIC METABOLIC PANEL
BUN: 21 mg/dL (ref 6–23)
CO2: 31 mEq/L (ref 19–32)
Calcium: 9.5 mg/dL (ref 8.4–10.5)
Chloride: 99 mEq/L (ref 96–112)
Creatinine, Ser: 1.21 mg/dL (ref 0.40–1.50)
GFR: 62.5 mL/min (ref >60.00)
Glucose, Bld: 107 mg/dL — ABNORMAL HIGH (ref 70–99)
Potassium: 4.6 mEq/L (ref 3.5–5.1)
Sodium: 139 mEq/L (ref 135–145)

## 2022-06-08 LAB — HEMOGLOBIN A1C: Hgb A1c MFr Bld: 7.4 % — ABNORMAL HIGH (ref 4.6–6.5)

## 2022-06-09 LAB — FRUCTOSAMINE: Fructosamine: 249 umol/L (ref 0–285)

## 2022-06-11 ENCOUNTER — Ambulatory Visit (INDEPENDENT_AMBULATORY_CARE_PROVIDER_SITE_OTHER): Payer: Medicare HMO | Admitting: Endocrinology

## 2022-06-11 VITALS — BP 120/84 | HR 98 | Ht 69.0 in | Wt 263.2 lb

## 2022-06-11 DIAGNOSIS — E1165 Type 2 diabetes mellitus with hyperglycemia: Secondary | ICD-10-CM

## 2022-06-11 DIAGNOSIS — Z794 Long term (current) use of insulin: Secondary | ICD-10-CM

## 2022-06-11 MED ORDER — ACCU-CHEK FASTCLIX LANCETS MISC: 3 refills | Status: DC

## 2022-06-11 NOTE — Patient Instructions (Signed)
Exercise daily

## 2022-06-11 NOTE — Progress Notes (Signed)
Patient ID: Nicholas Schroeder, male   DOB: [birth year], 67 y.o.   MRN: [id number]            Reason for Appointment: Follow-up   Referring physician: Suzanna Obey   History of Present Illness:          Date of diagnosis of type 2 diabetes mellitus: ? 2008        Background history:   Patient is unclear about when he was diagnosed to have diabetes and how his diagnosis was made Has been on metformin since diagnosis but not clear if he took other diabetes medications He was however started on insulin when his blood sugars are markedly increased in 2011 He thinks he has been on Lantus insulin all along with minimal change in doses  Recent history:   INSULIN regimen is: Soliqua 28 in a.m. and LANTUS 28 at bedtime       Non-insulin hypoglycemic drugs the patient is taking are: Metformin 2000 mg daily, Farxiga 10  mg daily  His A1c is 7.4 compared to 8.5  The previous range 6.4- 8.5  Current management, blood sugar patterns and problems identified: He was started on Soliqua in November but is now finally coming back for follow-up Blood sugars are significantly better when compared to his previous readings However could not download his meter and difficult to analyze his patterns He will have occasional high readings after dinner However morning sugars appear to be more consistently in the low 100 range No hypoglycemia also He does not have any change in his satiety level However has lost 6 pounds, previously had tendency to gaining weight He did titrate the [place] as directed and started to 15 units He is not doing any regular walking recently because of weather   Side effects from medications have been: None  Glucose monitoring:  done up to 2 times a day         Glucometer:  Accu-Chek .       Blood Glucose readings and averages by time of day by review   PRE-MEAL Fasting Lunch Dinner Bedtime Overall  Glucose range: 104-156 93, 155 149 166, 195   Mean/median:     134    POST-MEAL PC Breakfast PC Lunch PC Dinner  Glucose range:   ?  Mean/median:      Previously  PRE-MEAL Fasting Lunch Dinner Bedtime Overall  Glucose range: 121-187      Mean/median:     163   POST-MEAL PC Breakfast PC Lunch PC Dinner  Glucose range:  211, 217 117-165  Mean/median:        Self-care: Diet: Avoiding excess sweets, no regular soft drinks and sweet tea  .     Meal times are: Usually no breakfast, lunch: 12pm Dinner: 6-7                 Dietician visit, most recent: 07/02/16                Weight history:  Wt Readings from Last 3 Encounters:  06/11/22 263 lb 3.2 oz (119.4 kg)  03/17/22 269 lb 3.2 oz (122.1 kg)  11/13/21 263 lb 12.8 oz (119.7 kg)    Glycemic control:   Lab Results  Component Value Date   HGBA1C 7.4 (H) 06/08/2022   HGBA1C 8.5 (H) 03/15/2022   HGBA1C 7.8 (H) 11/10/2021   Lab Results  Component Value Date   MICROALBUR <0.7 08/11/2021   LDLCALC 75 08/11/2021   CREATININE 1.21 06/08/2022  Lab Results  Component Value Date   MICRALBCREAT 1.1 08/11/2021    Lab Results  Component Value Date   FRUCTOSAMINE 249 06/08/2022   FRUCTOSAMINE 227 01/04/2017   FRUCTOSAMINE 238 11/02/2016    Other active problems discussed: See review of systems    Allergies as of 06/11/2022   No Known Allergies      Medication List        Accurate as of June 11, 2022  3:47 PM. If you have any questions, ask your nurse or doctor.          Accu-Chek FastClix Lancets Misc Use accu chek fastclix to check blood sugar three times daily.   Accu-Chek Guide test strip Generic drug: glucose blood TEST BLOOD SUGAR THREE TIMES DAILY   Accu-Chek Guide w/Device Kit Use Accu Chek Guide to check blood sugar three times daily.   amLODipine 5 MG tablet Commonly known as: NORVASC Take by mouth.   aspirin 81 MG tablet Take 81 mg by mouth daily.   atorvastatin 10 MG tablet Commonly known as: LIPITOR TAKE 1 TABLET (10 MG TOTAL) DAILY.    azaTHIOprine 50 MG tablet Commonly known as: IMURAN Take 100 mg by mouth daily.   chlorthalidone 25 MG tablet Commonly known as: HYGROTON Take 12.5 mg by mouth every morning.   Dexcom G6 Receiver Devi Use to monitor blood sugars   Dexcom G6 Sensor Misc Use to monitor blood sugar, change after 10 days   Droplet Pen Needles 31G X 5 MM Misc Generic drug: Insulin Pen Needle USE  WITH  INSULIN  PEN AS DIRECTED   Farxiga 10 MG Tabs tablet Generic drug: dapagliflozin propanediol TAKE 1 TABLET EVERY DAY   hydrochlorothiazide 12.5 MG capsule Commonly known as: MICROZIDE TAKE 1 CAPSULE (12.5 MG TOTAL) BY MOUTH DAILY.   ketorolac 0.5 % ophthalmic solution Commonly known as: ACULAR Place 1 drop into the left eye 4 (four) times daily.   Lantus SoloStar 100 UNIT/ML Solostar Pen Generic drug: insulin glargine INJECT  28 UNITS IN THE MORNING  AND  28 UNITS IN THE EVENING   metFORMIN 500 MG tablet Commonly known as: GLUCOPHAGE TAKE 2 TABLETS TWICE A DAY WITH MEALS   moxifloxacin 0.5 % ophthalmic solution Commonly known as: VIGAMOX Place 1 drop into the left eye 4 (four) times daily.   OVER THE COUNTER MEDICATION   Soliqua 100-33 UNT-MCG/ML Sopn Generic drug: Insulin Glargine-Lixisenatide 30 Units acb   telmisartan 20 MG tablet Commonly known as: MICARDIS TAKE 1 TABLET EVERY DAY        Allergies: No Known Allergies  Past Medical History:  Diagnosis Date   Cataract    forming both per pt    Diabetes mellitus, type II ([place])    HLD (hyperlipidemia)    HTN (hypertension)    Obesity     Past Surgical History:  Procedure Laterality Date   ABDOMINAL SURGERY      Family History  Problem Relation Age of Onset   Diabetes Mother    Diabetes Father    Diabetes Brother    Colon polyps Sister    Esophageal cancer Neg Hx    Rectal cancer Neg Hx    Stomach cancer Neg Hx    Colon cancer Neg Hx     Social History:  reports that he has never smoked. He has never used  smokeless tobacco. He reports that he does not drink alcohol and does not use drugs.   Review of Systems   Lipid history:  LDL below 70, he has been on Lipitor 10 mg daily with good control He has taken this regularly   Lab Results  Component Value Date   CHOL 137 08/11/2021   CHOL 194 04/13/2021   CHOL 124 08/28/2020   Lab Results  Component Value Date   HDL 42.40 08/11/2021   HDL 51.10 04/13/2021   HDL 45.90 08/28/2020   Lab Results  Component Value Date   LDLCALC 75 08/11/2021   LDLCALC 123 (H) 04/13/2021   LDLCALC 66 08/28/2020   Lab Results  Component Value Date   TRIG 98.0 08/11/2021   TRIG 96.0 04/13/2021   TRIG 64.0 08/28/2020   Lab Results  Component Value Date   CHOLHDL 3 08/11/2021   CHOLHDL 4 04/13/2021   CHOLHDL 3 08/28/2020   No results found for: "LDLDIRECT"          Hypertension:  Treated with taking 20 MG telmisartan by PCP and also hydrochlorothiazide 12.5 mg Renal function previously was worse with 25 mg chlorthalidone  He checks blood pressures at home with a generic wrist instrument   BP Readings from Last 3 Encounters:  06/11/22 120/84  03/17/22 (!) 152/90  11/13/21 118/82    RENAL dysfunction and hyperkalemia: Creatinine/potassium history below  Lab Results  Component Value Date   K 4.6 06/08/2022   Lab Results  Component Value Date   CREATININE 1.21 06/08/2022   CREATININE 1.31 03/15/2022   CREATININE 1.24 11/10/2021    He does have regular eye exams, no retinopathy, last documented exam 3/23 with [facility]  Most recent foot exam: 7/22  He had a slightly low B12 level done by PCP and is on B12 supplements   Physical Examination:  BP 120/84 (BP Location: Left Arm, Patient Position: Sitting, Cuff Size: Normal)   Pulse 98   Ht '5\' 9"'$  (1.753 m)   Wt 263 lb 3.2 oz (119.4 kg)   SpO2 95%   BMI 38.87 kg/m     ASSESSMENT:  Diabetes type 2, with obesity, insulin requiring  See history of present illness for  detailed discussion of current diabetes management, blood sugar patterns and problems identified  His A1c is better at 7.4, previously was at 8.5  Current treatment regimen: Soliqua in the morning and Lantus insulin in the evening daily, metformin and Farxiga 10 mg  Blood sugars are averaging 134 at home compared to 160 previously Again he is only using his meter and has not started CGM as recommended and not clear if he will be able to also His blood sugars are reasonably well-controlled with the highest reading only occasionally at night and no hypoglycemia He will continue the same regimen  Reminded him to start walking daily  HYPERTENSION: Blood pressure is fairly good   Patient Instructions  Exercise daily     Elayne Snare 06/11/2022, 3:47 PM   Note: This office note was prepared with Dragon voice recognition system technology. Any transcriptional errors that result from this process are unintentional.

## 2022-08-02 ENCOUNTER — Other Ambulatory Visit: Payer: Self-pay | Admitting: Endocrinology

## 2022-08-27 ENCOUNTER — Other Ambulatory Visit: Payer: Self-pay | Admitting: Endocrinology

## 2022-09-13 ENCOUNTER — Other Ambulatory Visit: Payer: Self-pay | Admitting: Endocrinology

## 2022-10-12 ENCOUNTER — Other Ambulatory Visit (INDEPENDENT_AMBULATORY_CARE_PROVIDER_SITE_OTHER): Payer: Medicare HMO

## 2022-10-12 DIAGNOSIS — E1165 Type 2 diabetes mellitus with hyperglycemia: Secondary | ICD-10-CM | POA: Diagnosis not present

## 2022-10-12 DIAGNOSIS — Z794 Long term (current) use of insulin: Secondary | ICD-10-CM

## 2022-10-12 LAB — BASIC METABOLIC PANEL
BUN: 26 mg/dL — ABNORMAL HIGH (ref 6–23)
CO2: 27 mEq/L (ref 19–32)
Calcium: 9.2 mg/dL (ref 8.4–10.5)
Chloride: 101 mEq/L (ref 96–112)
Creatinine, Ser: 1.29 mg/dL (ref 0.40–1.50)
GFR: 57.74 mL/min — ABNORMAL LOW (ref >60.00)
Glucose, Bld: 154 mg/dL — ABNORMAL HIGH (ref 70–99)
Potassium: 4.2 mEq/L (ref 3.5–5.1)
Sodium: 138 mEq/L (ref 135–145)

## 2022-10-12 LAB — MICROALBUMIN / CREATININE URINE RATIO
Creatinine,U: 70.6 mg/dL
Microalb Creat Ratio: 1 mg/g (ref 0.0–30.0)
Microalb, Ur: <0.7 mg/dL (ref 0.0–1.9)

## 2022-10-12 LAB — HEMOGLOBIN A1C: Hgb A1c MFr Bld: 7 % — ABNORMAL HIGH (ref 4.6–6.5)

## 2022-10-15 ENCOUNTER — Encounter: Payer: Self-pay | Admitting: Endocrinology

## 2022-10-15 ENCOUNTER — Ambulatory Visit: Payer: Medicare HMO | Admitting: Endocrinology

## 2022-10-15 VITALS — BP 124/80 | HR 85 | Ht 69.0 in | Wt 263.2 lb

## 2022-10-15 DIAGNOSIS — E1165 Type 2 diabetes mellitus with hyperglycemia: Secondary | ICD-10-CM | POA: Diagnosis not present

## 2022-10-15 DIAGNOSIS — I10 Essential (primary) hypertension: Secondary | ICD-10-CM | POA: Diagnosis not present

## 2022-10-15 DIAGNOSIS — Z794 Long term (current) use of insulin: Secondary | ICD-10-CM

## 2022-10-15 MED ORDER — SOLIQUA 100-33 UNT-MCG/ML ~~LOC~~ SOPN: PEN_INJECTOR | SUBCUTANEOUS | 3 refills | Status: DC

## 2022-10-15 NOTE — Progress Notes (Signed)
Patient ID: Nicholas Schroeder, male   DOB: [birth year], 67 y.o.   MRN: [id number]            Reason for Appointment: Follow-up   Referring physician: Delbert Harness   History of Present Illness:          Date of diagnosis of type 2 diabetes mellitus: ? 2008        Background history:   Patient is unclear about when he was diagnosed to have diabetes and how his diagnosis was made Has been on metformin since diagnosis but not clear if he took other diabetes medications He was however started on insulin when his blood sugars are markedly increased in 2011 He thinks he has been on Lantus insulin all along with minimal change in doses  Recent history:   INSULIN regimen is: Soliqua 28 in a.m. and 28 at evening time  Non-insulin hypoglycemic drugs the patient is taking are: Metformin 2000 mg daily, Farxiga 10  mg daily  His A1c is 7, previously 7.4   The previous range 6.4- 8.5  Current management, blood sugar patterns and problems identified:  He says he ran out of Lantus over a month ago and for some reason did not request a refill On is only started taking Niger twice a day even though he was taking it only in the morning before He has not had any nausea with this Blood sugars appear to be still fluctuating at home but may be getting slightly lower readings below 90 sometimes morning or midday Sometimes he checks his sugars after meals in the evenings and not clear how long after with a few readings over 200 His weight is about the same He was recommended using the Dexcom but did not get the prescription through the DME supplier He is not doing any regular walking recently but is trying to use his exercise bike   Side effects from medications have been: None  Glucose monitoring:  done up to 2 times a day         Glucometer:  Accu-Chek .       Blood Glucose readings and averages by time of day by review   PRE-MEAL Fasting Lunch PM Bedtime Overall  Glucose range: 87-172  81-237 132-208    Mean/median:        Previously:  PRE-MEAL Fasting Lunch Dinner Bedtime Overall  Glucose range: 104-156 93, 155 149 166, 195   Mean/median:     134   POST-MEAL PC Breakfast PC Lunch PC Dinner  Glucose range:   ?  Mean/median:       Self-care: Diet: Avoiding excess sweets, no regular soft drinks and sweet tea  .     Meal times are: Usually no breakfast, lunch: 12pm Dinner: 6-7                 Dietician visit, most recent: 07/02/16                Weight history:  Wt Readings from Last 3 Encounters:  10/15/22 263 lb 3.2 oz (119.4 kg)  06/11/22 263 lb 3.2 oz (119.4 kg)  03/17/22 269 lb 3.2 oz (122.1 kg)    Glycemic control:   Lab Results  Component Value Date   HGBA1C 7.0 (H) 10/12/2022   HGBA1C 7.4 (H) 06/08/2022   HGBA1C 8.5 (H) 03/15/2022   Lab Results  Component Value Date   MICROALBUR <0.7 10/12/2022   LDLCALC 75 08/11/2021   CREATININE 1.29 10/12/2022  Lab Results  Component Value Date   MICRALBCREAT 1.0 10/12/2022    Lab Results  Component Value Date   FRUCTOSAMINE 249 06/08/2022   FRUCTOSAMINE 227 01/04/2017   FRUCTOSAMINE 238 11/02/2016    Other active problems discussed: See review of systems    Allergies as of 10/15/2022   No Known Allergies      Medication List        Accurate as of Oct 15, 2022  8:42 AM. If you have any questions, ask your nurse or doctor.          STOP taking these medications    Lantus SoloStar 100 UNIT/ML Solostar Pen Generic drug: insulin glargine Stopped by: Reather Littler, MD       TAKE these medications    Accu-Chek FastClix Lancets Misc Use accu chek fastclix to check blood sugar three times daily.   Accu-Chek Guide test strip Generic drug: glucose blood TEST BLOOD SUGAR THREE TIMES DAILY   Accu-Chek Guide w/Device Kit Use Accu Chek Guide to check blood sugar three times daily.   amLODipine 5 MG tablet Commonly known as: NORVASC Take by mouth.   aspirin 81 MG tablet Take 81  mg by mouth daily.   atorvastatin 10 MG tablet Commonly known as: LIPITOR TAKE 1 TABLET (10 MG TOTAL) DAILY.   azaTHIOprine 50 MG tablet Commonly known as: IMURAN Take 100 mg by mouth daily.   chlorthalidone 25 MG tablet Commonly known as: HYGROTON Take 12.5 mg by mouth every morning.   Dexcom G6 Receiver Devi Use to monitor blood sugars   Dexcom G6 Sensor Misc Use to monitor blood sugar, change after 10 days   Droplet Pen Needles 31G X 5 MM Misc Generic drug: Insulin Pen Needle USE WITH INSULIN PEN AS DIRECTED   Farxiga 10 MG Tabs tablet Generic drug: dapagliflozin propanediol TAKE 1 TABLET EVERY DAY   hydrochlorothiazide 12.5 MG capsule Commonly known as: MICROZIDE TAKE 1 CAPSULE (12.5 MG TOTAL) BY MOUTH DAILY.   ketorolac 0.5 % ophthalmic solution Commonly known as: ACULAR Place 1 drop into the left eye 4 (four) times daily.   metFORMIN 500 MG tablet Commonly known as: GLUCOPHAGE TAKE 2 TABLETS TWICE A DAY WITH MEALS   moxifloxacin 0.5 % ophthalmic solution Commonly known as: VIGAMOX Place 1 drop into the left eye 4 (four) times daily.   OVER THE COUNTER MEDICATION   Soliqua 100-33 UNT-MCG/ML Sopn Generic drug: Insulin Glargine-Lixisenatide INJECT 28 UNITS UNDER THE SKIN BEFORE BREAKFAST and 28 before supper What changed: additional instructions Changed by: Reather Littler, MD   telmisartan 20 MG tablet Commonly known as: MICARDIS TAKE 1 TABLET EVERY DAY        Allergies: No Known Allergies  Past Medical History:  Diagnosis Date   Cataract    forming both per pt    Diabetes mellitus, type II (HCC)    HLD (hyperlipidemia)    HTN (hypertension)    Obesity     Past Surgical History:  Procedure Laterality Date   ABDOMINAL SURGERY      Family History  Problem Relation Age of Onset   Diabetes Mother    Diabetes Father    Diabetes Brother    Colon polyps Sister    Esophageal cancer Neg Hx    Rectal cancer Neg Hx    Stomach cancer Neg Hx     Colon cancer Neg Hx     Social History:  reports that he has never smoked. He has never used smokeless tobacco.  He reports that he does not drink alcohol and does not use drugs.   Review of Systems   Lipid history: LDL below 70, he has been on Lipitor 10 mg daily with good control He has taken this regularly   Lab Results  Component Value Date   CHOL 137 08/11/2021   CHOL 194 04/13/2021   CHOL 124 08/28/2020   Lab Results  Component Value Date   HDL 42.40 08/11/2021   HDL 51.10 04/13/2021   HDL 45.90 08/28/2020   Lab Results  Component Value Date   LDLCALC 75 08/11/2021   LDLCALC 123 (H) 04/13/2021   LDLCALC 66 08/28/2020   Lab Results  Component Value Date   TRIG 98.0 08/11/2021   TRIG 96.0 04/13/2021   TRIG 64.0 08/28/2020   Lab Results  Component Value Date   CHOLHDL 3 08/11/2021   CHOLHDL 4 04/13/2021   CHOLHDL 3 08/28/2020   No results found for: "LDLDIRECT"          Hypertension:  Treated with taking 20 MG telmisartan by PCP and also hydrochlorothiazide 12.5 mg Renal function previously was worse with 25 mg chlorthalidone  He checks blood pressures at home with a generic wrist instrument   BP Readings from Last 3 Encounters:  10/15/22 124/80  06/11/22 120/84  03/17/22 (!) 152/90    RENAL functions as follows   Lab Results  Component Value Date   CREATININE 1.29 10/12/2022   CREATININE 1.21 06/08/2022   CREATININE 1.31 03/15/2022   No recent hyperkalemia  Lab Results  Component Value Date   K 4.2 10/12/2022   He does have regular eye exams, no retinopathy, last documented exam 8/23 with [facility]  Most recent foot exam: 5/24  He had a slightly low B12 level done by PCP and is on B12 supplements   Physical Examination:  BP 124/80   Pulse 85   Ht 5\' 9"  (1.753 m)   Wt 263 lb 3.2 oz (119.4 kg)   SpO2 95%   BMI 38.87 kg/m   Diabetic Foot Exam - Simple   Simple Foot Form Diabetic Foot exam was performed with the following  findings: Yes   Visual Inspection No deformities, no ulcerations, no other skin breakdown bilaterally: Yes Sensation Testing Intact to touch and monofilament testing bilaterally: Yes Pulse Check Posterior Tibialis and Dorsalis pulse intact bilaterally: Yes Comments      ASSESSMENT:  Diabetes type 2, with obesity, insulin requiring  See history of present illness for detailed discussion of current diabetes management, blood sugar patterns and problems identified  His A1c is better at 7%, progressively improving  Current treatment regimen: Soliqua twice a day Also on metformin and Farxiga 10 mg  Although he has not taken Lantus in the evening as previously suggested and doing Niger twice a day this appears to be working somewhat better with improved A1c and no side effects like nausea However has not lost any further weight Blood sugars are quite variable and difficult to analyze his blood sugar patterns He does report infrequent low sugars on waking up but not overnight  He will continue the same regimen except reduced his evening Soliqua dose to 26 units and take before suppertime Updated prescription for Soliqua to his mail order pharmacy  Reminded him to start walking or other exercise consistently Cut back on any high fat or high glycemic index foods If he checks his sugars in the evenings he will be 2 hours after eating  HYPERTENSION: Blood pressure  is well-controlled, no change in medications needed  Renal function and potassium stable and microalbumin normal also     Patient Instructions  Check blood sugars on waking up  3  days a week  Also check blood sugars about 2 hours after meals and do this after different meals by rotation  Recommended blood sugar levels on waking up are 90-130 and about 2 hours after meal is 130-160  Please bring your blood sugar monitor to each visit, thank you  Take 28 in am and 26 at supper      Reather Littler 10/15/2022, 8:42  AM   Note: This office note was prepared with Dragon voice recognition system technology. Any transcriptional errors that result from this process are unintentional.

## 2022-10-15 NOTE — Patient Instructions (Addendum)
Check blood sugars on waking up  3  days a week  Also check blood sugars about 2 hours after meals and do this after different meals by rotation  Recommended blood sugar levels on waking up are 90-130 and about 2 hours after meal is 130-160  Please bring your blood sugar monitor to each visit, thank you  Take 28 in am and 26 at supper

## 2022-10-18 ENCOUNTER — Other Ambulatory Visit: Payer: Self-pay

## 2022-10-18 ENCOUNTER — Telehealth: Payer: Self-pay

## 2022-10-18 MED ORDER — SOLIQUA 100-33 UNT-MCG/ML ~~LOC~~ SOPN: PEN_INJECTOR | SUBCUTANEOUS | 3 refills | Status: DC

## 2022-10-18 NOTE — Telephone Encounter (Signed)
[facility] needs clarification that Nicholas Schroeder is supposed to be 2 times daily and not once a day.

## 2022-11-12 ENCOUNTER — Other Ambulatory Visit: Payer: Self-pay | Admitting: Endocrinology

## 2023-02-03 ENCOUNTER — Other Ambulatory Visit (INDEPENDENT_AMBULATORY_CARE_PROVIDER_SITE_OTHER): Payer: Medicare HMO

## 2023-02-03 DIAGNOSIS — Z794 Long term (current) use of insulin: Secondary | ICD-10-CM

## 2023-02-03 DIAGNOSIS — E1165 Type 2 diabetes mellitus with hyperglycemia: Secondary | ICD-10-CM

## 2023-02-03 LAB — BASIC METABOLIC PANEL
BUN: 19 mg/dL (ref 6–23)
CO2: 30 mEq/L (ref 19–32)
Calcium: 9.5 mg/dL (ref 8.4–10.5)
Chloride: 101 mEq/L (ref 96–112)
Creatinine, Ser: 1.38 mg/dL (ref 0.40–1.50)
GFR: 53.13 mL/min — ABNORMAL LOW (ref >60.00)
Glucose, Bld: 119 mg/dL — ABNORMAL HIGH (ref 70–99)
Potassium: 4.2 mEq/L (ref 3.5–5.1)
Sodium: 141 mEq/L (ref 135–145)

## 2023-02-03 LAB — HEMOGLOBIN A1C: Hgb A1c MFr Bld: 7.8 % — ABNORMAL HIGH (ref 4.6–6.5)

## 2023-02-08 ENCOUNTER — Ambulatory Visit: Payer: Medicare HMO | Admitting: Endocrinology

## 2023-02-15 ENCOUNTER — Ambulatory Visit (INDEPENDENT_AMBULATORY_CARE_PROVIDER_SITE_OTHER): Payer: Medicare HMO | Admitting: Endocrinology

## 2023-02-15 ENCOUNTER — Encounter: Payer: Self-pay | Admitting: Endocrinology

## 2023-02-15 VITALS — BP 123/63 | HR 84 | Ht 69.0 in | Wt 266.0 lb

## 2023-02-15 DIAGNOSIS — E1165 Type 2 diabetes mellitus with hyperglycemia: Secondary | ICD-10-CM

## 2023-02-15 DIAGNOSIS — E7800 Pure hypercholesterolemia, unspecified: Secondary | ICD-10-CM

## 2023-02-15 DIAGNOSIS — Z794 Long term (current) use of insulin: Secondary | ICD-10-CM | POA: Diagnosis not present

## 2023-02-15 NOTE — Patient Instructions (Signed)
No change in diabetes medications.   Bring meter in follow up visit.

## 2023-02-15 NOTE — Progress Notes (Signed)
Outpatient Endocrinology Note [name], MD  02/15/23  Patient's Name: Nicholas Schroeder    DOB: 1956-05-13    MRN: [id number]                                                    REASON OF VISIT: Follow-up of type 2 diabetes mellitus  REFERRING PROVIDER:   PCP: Macy Mis, MD  HISTORY OF PRESENT ILLNESS:   Nicholas Schroeder is a 67 y.o. old male with past medical history listed below, is here for follow-up type 2 diabetes mellitus.  Patient was last seen by Dr. Lucianne Muss in May 2024.  Pertinent Diabetes History: He was diagnosed with type 2 diabetes mellitus around 2008.  He was initially treated with metformin and insulin therapy was started in 2011.  Chronic Diabetes Complications : Retinopathy: no. Last ophthalmology exam was done on annually, reportedly. Nephropathy: CKD III a, on telmisartan. Peripheral neuropathy: no Coronary artery disease: no Stroke: no  Relevant comorbidities and cardiovascular risk factors: Obesity: yes Body mass index is 39.28 kg/m.  Hypertension: yes Hyperlipidemia. Yes, on a statin.  Current / Home Diabetic regimen includes: Soliqua 28 in AM and 28 in PM Farxiga 10 mg daily. Metformin 1000 mg 2 times a day. Prior diabetic medications:  Glycemic data:   He forgot to bring glucometer in the clinic today.  He has Accu-Chek glucometer checking 2-3 times a day.  He calls some of the blood sugar 138 this morning all the time he had up to 200 in the evening.  He recalls few days ago he had blood sugar up to 60 around 2 AM.  Dexcom was recommended in the past did not get prescription through from DME supplier.  Factors modifying glucose control: 1.  Diabetic diet assessment: 2-3 meals a day.  Avoiding excess sweets.  No regular soft drinks and sweet tea.  2.  Staying active or exercising: Walking occasionally.  3.  Medication compliance: compliant all of the time.  Interval history 02/15/23 He forgot to bring the glucometer, no sugar data to  review.  Hemoglobin A1c recently 7.8% slightly worsening.  No vision problem.  No numbness and tingling of the feet.  No other complaints today.  REVIEW OF SYSTEMS As per history of present illness.   PAST MEDICAL HISTORY: Past Medical History:  Diagnosis Date   Cataract    forming both per pt    Diabetes mellitus, type II (HCC)    HLD (hyperlipidemia)    HTN (hypertension)    Obesity     PAST SURGICAL HISTORY: Past Surgical History:  Procedure Laterality Date   ABDOMINAL SURGERY      ALLERGIES: No Known Allergies  FAMILY HISTORY:  Family History  Problem Relation Age of Onset   Diabetes Mother    Diabetes Father    Diabetes Brother    Colon polyps Sister    Esophageal cancer Neg Hx    Rectal cancer Neg Hx    Stomach cancer Neg Hx    Colon cancer Neg Hx     SOCIAL HISTORY: Social History   Socioeconomic History   Marital status: Single    Spouse name: Not on file   Number of children: Not on file   Years of education: Not on file   Highest education level: Not on file  Occupational History  Not on file  Tobacco Use   Smoking status: Never   Smokeless tobacco: Never  Substance and Sexual Activity   Alcohol use: No   Drug use: No   Sexual activity: Not on file  Other Topics Concern   Not on file  Social History Narrative   Not on file   Social Determinants of Health   Financial Resource Strain: Medium Risk (08/02/2022)   Received from [facility], [facility]   Overall Financial Resource Strain (CARDIA)    Difficulty of Paying Living Expenses: Somewhat hard  Food Insecurity: No Food Insecurity (08/02/2022)   Received from [facility], [facility]   Hunger Vital Sign    Worried About Running Out of Food in the Last Year: Never true    Ran Out of Food in the Last Year: Never true  Transportation Needs: No Transportation Needs (08/02/2022)   Received from [facility], [facility]   PRAPARE - Transportation    Lack of Transportation  (Medical): No    Lack of Transportation (Non-Medical): No  Physical Activity: Insufficiently Active (08/02/2022)   Received from [facility], [facility]   Exercise Vital Sign    Days of Exercise per Week: 3 days    Minutes of Exercise per Session: 40 min  Stress: No Stress Concern Present (08/02/2022)   Received from [facility], [facility] - Occupational Stress Questionnaire    Feeling of Stress : Not at all  Social Connections: Socially Integrated (08/02/2022)   Received from [facility], [facility]   Social Network    How would you rate your social network (family, work, friends)?: Good participation with social networks    MEDICATIONS:  Current Outpatient Medications  Medication Sig Dispense Refill   ACCU-CHEK GUIDE test strip TEST BLOOD SUGAR THREE TIMES DAILY 300 strip 3   Accu-Chek Softclix Lancets lancets TEST BLOOD SUGAR THREE TIMES DAILY 300 each 3   amLODipine (NORVASC) 5 MG tablet Take by mouth.     aspirin 81 MG tablet Take 81 mg by mouth daily.     atorvastatin (LIPITOR) 10 MG tablet TAKE 1 TABLET (10 MG TOTAL) DAILY. 90 tablet 3   azaTHIOprine (IMURAN) 50 MG tablet Take 100 mg by mouth daily.     Blood Glucose Monitoring Suppl (ACCU-CHEK GUIDE) w/Device KIT Use Accu Chek Guide to check blood sugar three times daily. 1 kit 0   chlorthalidone (HYGROTON) 25 MG tablet Take 12.5 mg by mouth every morning.     DROPLET PEN NEEDLES 31G X 5 MM MISC USE WITH INSULIN PEN AS DIRECTED 300 each 3   FARXIGA 10 MG TABS tablet TAKE 1 TABLET EVERY DAY 90 tablet 3   hydrochlorothiazide (MICROZIDE) 12.5 MG capsule TAKE 1 CAPSULE (12.5 MG TOTAL) BY MOUTH DAILY. 90 capsule 0   Insulin Glargine-Lixisenatide (SOLIQUA) 100-33 UNT-MCG/ML SOPN INJECT 28 UNITS UNDER THE SKIN BEFORE BREAKFAST and 28 before supper 54 mL 3   metFORMIN (GLUCOPHAGE) 500 MG tablet TAKE 2 TABLETS TWICE A DAY WITH MEALS 360 tablet 3   OVER THE COUNTER MEDICATION       telmisartan (MICARDIS) 20 MG tablet TAKE 1 TABLET EVERY DAY 90 tablet 3   Continuous Blood Gluc Receiver (DEXCOM G6 RECEIVER) DEVI Use to monitor blood sugars (Patient not taking: Reported on 10/15/2022) 1 each 0   Continuous Blood Gluc Sensor (DEXCOM G6 SENSOR) MISC Use to monitor blood sugar, change after 10 days (Patient not taking: Reported on 10/15/2022)  3 each 3   ketorolac (ACULAR) 0.5 % ophthalmic solution Place 1 drop into the left eye 4 (four) times daily. (Patient not taking: Reported on 10/15/2022)     moxifloxacin (VIGAMOX) 0.5 % ophthalmic solution Place 1 drop into the left eye 4 (four) times daily. (Patient not taking: Reported on 10/15/2022)     No current facility-administered medications for this visit.    PHYSICAL EXAM: Vitals:   02/15/23 0842  BP: 123/63  Pulse: 84  SpO2: 97%  Weight: 266 lb (120.7 kg)  Height: 5\' 9"  (1.753 m)   Body mass index is 39.28 kg/m.  Wt Readings from Last 3 Encounters:  02/15/23 266 lb (120.7 kg)  10/15/22 263 lb 3.2 oz (119.4 kg)  06/11/22 263 lb 3.2 oz (119.4 kg)    General: Well developed, well nourished male in no apparent distress.  HEENT: AT/[place], no external lesions.  Eyes: Conjunctiva clear and no icterus. Neck: Neck supple  Lungs: Respirations not labored Neurologic: Alert, oriented, normal speech Extremities / Skin: Dry. No sores or rashes noted.  Psychiatric: Does not appear depressed or anxious  Diabetic Foot Exam - Simple   No data filed    LABS Reviewed Lab Results  Component Value Date   HGBA1C 7.8 (H) 02/03/2023   HGBA1C 7.0 (H) 10/12/2022   HGBA1C 7.4 (H) 06/08/2022   Lab Results  Component Value Date   FRUCTOSAMINE 249 06/08/2022   FRUCTOSAMINE 227 01/04/2017   FRUCTOSAMINE 238 11/02/2016   Lab Results  Component Value Date   CHOL 137 08/11/2021   HDL 42.40 08/11/2021   LDLCALC 75 08/11/2021   TRIG 98.0 08/11/2021   CHOLHDL 3 08/11/2021   Lab Results  Component Value Date   MICRALBCREAT 1.0  10/12/2022   MICRALBCREAT 1.1 08/11/2021   Lab Results  Component Value Date   CREATININE 1.38 02/03/2023   Lab Results  Component Value Date   GFR 53.13 (L) 02/03/2023    ASSESSMENT / PLAN  1. Uncontrolled type 2 diabetes mellitus with hyperglycemia, with long-term current use of insulin (HCC)   2. Hypercholesterolemia     Diabetes Mellitus type 2, complicated by CKD. - Diabetic status / severity: Uncontrolled.  Lab Results  Component Value Date   HGBA1C 7.8 (H) 02/03/2023    - Hemoglobin A1c goal : <7%  Diabetes control worsening however not able to make adjustment on the regimen today, no glucose data available to review, he reported hypoglycemia few days ago and other time he has a stable blood sugar 138 and also 200.  - Medications: see below.  I) continue Soliqua 28 units 2 times a day. II) continue metformin 1000 mg 2 times a day. III) continue Farxiga 10 mg daily.  - Home glucose testing: Before meals and at bedtime. - Discussed/ Gave Hypoglycemia treatment plan.  # Consult : not required at this time.   # Annual urine for microalbuminuria/ creatinine ratio, no microalbuminuria currently, continue ACE/ARB /telmisartan. Last  Lab Results  Component Value Date   MICRALBCREAT 1.0 10/12/2022    # Foot check nightly.  # Annual dilated diabetic eye exams.   - Diet: Make healthy diabetic food choices - Life style / activity / exercise: Discussed.  Advised for to increase physical activity /walking.  And if possible formal exercise.  2. Blood pressure  -  BP Readings from Last 1 Encounters:  02/15/23 123/63    - Control is in target.  - No change in current plans.  3. Lipid status /  Hyperlipidemia - Last  Lab Results  Component Value Date   LDLCALC 75 08/11/2021   - Continue atorvastatin 10 mg daily.  Will do lab in next follow-up visit.  Diagnoses and all orders for this visit:  Uncontrolled type 2 diabetes mellitus with hyperglycemia, with  long-term current use of insulin (HCC) -     Lipid panel; Future -     Basic metabolic panel; Future -     Hemoglobin A1c; Future  Hypercholesterolemia -     Lipid panel; Future    DISPOSITION Follow up in clinic in 3 months suggested.   All questions answered and patient verbalized understanding of the plan.  [place] Hilda Wexler, MD [facility] Endocrinology [facility] [address] [place], [address] [place], [place] [address] Phone # [phone]  At least part of this note was generated using voice recognition software. Inadvertent word errors may have occurred, which were not recognized during the proofreading process.

## 2023-05-13 ENCOUNTER — Other Ambulatory Visit: Payer: Self-pay

## 2023-05-13 ENCOUNTER — Other Ambulatory Visit: Payer: Medicare HMO

## 2023-05-13 DIAGNOSIS — E1165 Type 2 diabetes mellitus with hyperglycemia: Secondary | ICD-10-CM

## 2023-05-13 DIAGNOSIS — E7800 Pure hypercholesterolemia, unspecified: Secondary | ICD-10-CM

## 2023-05-16 ENCOUNTER — Other Ambulatory Visit: Payer: Medicare HMO

## 2023-05-17 LAB — BASIC METABOLIC PANEL WITHOUT GFR
BUN: 17 mg/dL (ref 7–25)
CO2: 29 mmol/L (ref 20–32)
Calcium: 9.4 mg/dL (ref 8.6–10.3)
Chloride: 101 mmol/L (ref 98–110)
Creat: 1.28 mg/dL (ref 0.70–1.35)
Glucose, Bld: 80 mg/dL (ref 65–139)
Potassium: 4.4 mmol/L (ref 3.5–5.3)
Sodium: 141 mmol/L (ref 135–146)
eGFR: 61 mL/min/{1.73_m2}

## 2023-05-17 LAB — LIPID PANEL
Cholesterol: 134 mg/dL (ref <200)
HDL: 45 mg/dL (ref >=40)
LDL Cholesterol (Calc): 71 mg/dL
Non-HDL Cholesterol (Calc): 89 mg/dL (ref <130)
Total CHOL/HDL Ratio: 3 (calc) (ref <5.0)
Triglycerides: 93 mg/dL (ref <150)

## 2023-05-17 LAB — HEMOGLOBIN A1C
Hgb A1c MFr Bld: 7.3 %{Hb} — ABNORMAL HIGH (ref <5.7)
Mean Plasma Glucose: 163 mg/dL
eAG (mmol/L): 9 mmol/L

## 2023-05-19 ENCOUNTER — Encounter: Payer: Self-pay | Admitting: Endocrinology

## 2023-05-19 ENCOUNTER — Ambulatory Visit (INDEPENDENT_AMBULATORY_CARE_PROVIDER_SITE_OTHER): Payer: Medicare HMO | Admitting: Endocrinology

## 2023-05-19 VITALS — BP 120/70 | HR 90 | Resp 20 | Ht 69.0 in | Wt 268.8 lb

## 2023-05-19 DIAGNOSIS — E1165 Type 2 diabetes mellitus with hyperglycemia: Secondary | ICD-10-CM

## 2023-05-19 DIAGNOSIS — I10 Essential (primary) hypertension: Secondary | ICD-10-CM

## 2023-05-19 DIAGNOSIS — E7800 Pure hypercholesterolemia, unspecified: Secondary | ICD-10-CM | POA: Diagnosis not present

## 2023-05-19 DIAGNOSIS — Z794 Long term (current) use of insulin: Secondary | ICD-10-CM

## 2023-05-19 MED ORDER — ATORVASTATIN CALCIUM 10 MG PO TABS: 10.0000 mg | ORAL_TABLET | Freq: Every day | ORAL | 3 refills | Status: DC

## 2023-05-19 MED ORDER — DAPAGLIFLOZIN PROPANEDIOL 10 MG PO TABS: 10.0000 mg | ORAL_TABLET | Freq: Every day | ORAL | 3 refills | Status: DC

## 2023-05-19 MED ORDER — METFORMIN HCL 500 MG PO TABS: 1000.0000 mg | ORAL_TABLET | Freq: Two times a day (BID) | ORAL | 3 refills | Status: DC

## 2023-05-19 MED ORDER — TELMISARTAN 20 MG PO TABS: 20.0000 mg | ORAL_TABLET | Freq: Every day | ORAL | 3 refills | Status: AC

## 2023-05-19 MED ORDER — SOLIQUA 100-33 UNT-MCG/ML ~~LOC~~ SOPN: PEN_INJECTOR | SUBCUTANEOUS | 3 refills | Status: DC

## 2023-05-19 NOTE — Progress Notes (Signed)
 Outpatient Endocrinology Note Ramzi Brathwaite, MD  05/19/23  Patient's Name: Nicholas Schroeder    DOB: Jan 21, 1956    MRN: [id number]                                                    REASON OF VISIT: Follow-up of type 2 diabetes mellitus  PCP: Rena Luke POUR, MD  HISTORY OF PRESENT ILLNESS:   Nicholas Schroeder is a 68 y.o. old male with past medical history listed below, is here for follow-up type 2 diabetes mellitus.    Pertinent Diabetes History: He was diagnosed with type 2 diabetes mellitus around 2008.  He was initially treated with metformin  and insulin  therapy was started in 2011.  Chronic Diabetes Complications : Retinopathy: no. Last ophthalmology exam was done on annually, reportedly. Nephropathy: CKD III a, on telmisartan . Peripheral neuropathy: no Coronary artery disease: no Stroke: no  Relevant comorbidities and cardiovascular risk factors: Obesity: yes Body mass index is 39.69 kg/m.  Hypertension: yes Hyperlipidemia. Yes, on a statin.  Current / Home Diabetic regimen includes: Soliqua  28 in AM and 28 in PM Farxiga  10 mg daily. Metformin  1000 mg 2 times a day.  Prior diabetic medications:  Glycemic data:   Accu-Chek guide glucometer glucose data from December 19 to May 19, 2023 reviewed.  Average blood sugar 127.  Checking blood sugar 2-3 times a day.  Fasting blood sugar mostly in the low 100 range.  Blood sugar in the afternoon 72-149.  Blood sugar at bedtime 91-185.  1 occasional blood sugar 59 however recheck was 211.  Most of the blood sugar are acceptable.  No hypoglycemia.  Dexcom was recommended in the past did not get prescription through from DME supplier.  Factors modifying glucose control: 1.  Diabetic diet assessment: 2-3 meals a day.  Avoiding excess sweets.  No regular soft drinks and sweet tea.  2.  Staying active or exercising: Walking occasionally.  3.  Medication compliance: compliant all of the time.  Interval history Glucometer data  as reviewed above.  Diabetes regimen as noted above.  Recent hemoglobin A1c 7.3% improving.  Recent lab results reviewed with normal/acceptable cholesterol level.  Denies any complaints today.  REVIEW OF SYSTEMS As per history of present illness.   PAST MEDICAL HISTORY: Past Medical History:  Diagnosis Date   Cataract    forming both per pt    Diabetes mellitus, type II (HCC)    HLD (hyperlipidemia)    HTN (hypertension)    Obesity     PAST SURGICAL HISTORY: Past Surgical History:  Procedure Laterality Date   ABDOMINAL SURGERY      ALLERGIES: No Known Allergies  FAMILY HISTORY:  Family History  Problem Relation Age of Onset   Diabetes Mother    Diabetes Father    Diabetes Brother    Colon polyps Sister    Esophageal cancer Neg Hx    Rectal cancer Neg Hx    Stomach cancer Neg Hx    Colon cancer Neg Hx     SOCIAL HISTORY: Social History   Socioeconomic History   Marital status: Single    Spouse name: Not on file   Number of children: Not on file   Years of education: Not on file   Highest education level: Not on file  Occupational History   Not on  file  Tobacco Use   Smoking status: Never   Smokeless tobacco: Never  Substance and Sexual Activity   Alcohol use: No   Drug use: No   Sexual activity: Not on file  Other Topics Concern   Not on file  Social History Narrative   Not on file   Social Drivers of Health   Financial Resource Strain: Medium Risk (08/02/2022)   Received from [facility]   Overall Financial Resource Strain (CARDIA)    Difficulty of Paying Living Expenses: Somewhat hard  Food Insecurity: No Food Insecurity (08/02/2022)   Received from [facility], [facility]   Hunger Vital Sign    Worried About Running Out of Food in the Last Year: Never true    Ran Out of Food in the Last Year: Never true  Transportation Needs: No Transportation Needs (08/02/2022)   Received from [facility], [facility]   PRAPARE -  Transportation    Lack of Transportation (Medical): No    Lack of Transportation (Non-Medical): No  Physical Activity: Insufficiently Active (08/02/2022)   [facility]   Exercise Vital Sign    Days of Exercise per Week: 3 days    Minutes of Exercise per Session: 40 min  Stress: No Stress Concern Present (08/02/2022)   Received from [facility], [facility] of Occupational Health - Occupational Stress Questionnaire    Feeling of Stress : Not at all  Social Connections: Socially Integrated (08/02/2022)   Received from [facility], [facility]   Social Network    How would you rate your social network (family, work, friends)?: Good participation with social networks    MEDICATIONS:  Current Outpatient Medications  Medication Sig Dispense Refill   ACCU-CHEK GUIDE test strip TEST BLOOD SUGAR THREE TIMES DAILY 300 strip 3   Accu-Chek Softclix Lancets lancets TEST BLOOD SUGAR THREE TIMES DAILY 300 each 3   amLODipine (NORVASC) 5 MG tablet Take by mouth.     aspirin 81 MG tablet Take 81 mg by mouth daily.     azaTHIOprine (IMURAN) 50 MG tablet Take 100 mg by mouth daily.     Blood Glucose Monitoring Suppl (ACCU-CHEK GUIDE) w/Device KIT Use Accu Chek Guide to check blood sugar three times daily. 1 kit 0   chlorthalidone (HYGROTON) 25 MG tablet Take 12.5 mg by mouth every morning.     DROPLET PEN NEEDLES 31G X 5 MM MISC USE WITH INSULIN  PEN AS DIRECTED 300 each 3   hydrochlorothiazide  (MICROZIDE ) 12.5 MG capsule TAKE 1 CAPSULE (12.5 MG TOTAL) BY MOUTH DAILY. 90 capsule 0   ketorolac (ACULAR) 0.5 % ophthalmic solution Place 1 drop into the left eye 4 (four) times daily.     moxifloxacin (VIGAMOX) 0.5 % ophthalmic solution Place 1 drop into the left eye 4 (four) times daily.     OVER THE COUNTER MEDICATION      atorvastatin  (LIPITOR) 10 MG tablet Take 1 tablet (10 mg total) by mouth daily. 90 tablet 3   dapagliflozin  propanediol (FARXIGA )  10 MG TABS tablet Take 1 tablet (10 mg total) by mouth daily. 90 tablet 3   Insulin  Glargine-Lixisenatide (SOLIQUA ) 100-33 UNT-MCG/ML SOPN INJECT 28 UNITS UNDER THE SKIN BEFORE BREAKFAST and 28 before supper 54 mL 3   metFORMIN  (GLUCOPHAGE ) 500 MG tablet Take 2 tablets (1,000 mg total) by mouth 2 (two) times daily with a meal. 360 tablet 3   telmisartan  (MICARDIS ) 20 MG tablet Take 1  tablet (20 mg total) by mouth daily. 90 tablet 3   No current facility-administered medications for this visit.    PHYSICAL EXAM: Vitals:   05/19/23 0817  BP: 120/70  Pulse: 90  Resp: 20  SpO2: 95%  Weight: 268 lb 12.8 oz (121.9 kg)  Height: 5' 9 (1.753 m)    Body mass index is 39.69 kg/m.  Wt Readings from Last 3 Encounters:  05/19/23 268 lb 12.8 oz (121.9 kg)  02/15/23 266 lb (120.7 kg)  10/15/22 263 lb 3.2 oz (119.4 kg)    General: Well developed, well nourished male in no apparent distress.  HEENT: AT/[place], no external lesions.  Eyes: Conjunctiva clear and no icterus. Neck: Neck supple  Lungs: Respirations not labored Neurologic: Alert, oriented, normal speech Extremities / Skin: Dry.   Psychiatric: Does not appear depressed or anxious  Diabetic Foot Exam - Simple   No data filed    LABS Reviewed Lab Results  Component Value Date   HGBA1C 7.3 (H) 05/16/2023   HGBA1C 7.8 (H) 02/03/2023   HGBA1C 7.0 (H) 10/12/2022   Lab Results  Component Value Date   FRUCTOSAMINE 249 06/08/2022   FRUCTOSAMINE 227 01/04/2017   FRUCTOSAMINE 238 11/02/2016   Lab Results  Component Value Date   CHOL 134 05/16/2023   HDL 45 05/16/2023   LDLCALC 71 05/16/2023   TRIG 93 05/16/2023   CHOLHDL 3.0 05/16/2023   Lab Results  Component Value Date   MICRALBCREAT 1.0 10/12/2022   MICRALBCREAT 1.1 08/11/2021   Lab Results  Component Value Date   CREATININE 1.28 05/16/2023   Lab Results  Component Value Date   GFR 53.13 (L) 02/03/2023    ASSESSMENT / PLAN  1. Uncontrolled type 2 diabetes  mellitus with hyperglycemia, with long-term current use of insulin  (HCC)   2. Hypercholesterolemia   3. Essential hypertension      Diabetes Mellitus type 2, complicated by CKD. - Diabetic status / severity: Uncontrolled.  Improving.  Lab Results  Component Value Date   HGBA1C 7.3 (H) 05/16/2023    - Hemoglobin A1c goal : <7%  Most of the blood sugar on glucometer acceptable with average blood sugar in last 2 weeks 127 and checking blood sugar 2-3 times a day.  - Medications: see below.  No change.  I) continue Soliqua  28 units 2 times a day. II) continue metformin  1000 mg 2 times a day. III) continue Farxiga  10 mg daily.  - Home glucose testing: Before meals and at bedtime. - Discussed/ Gave Hypoglycemia treatment plan.  # Consult : not required at this time.   # Annual urine for microalbuminuria/ creatinine ratio, no microalbuminuria currently, continue ACE/ARB /telmisartan . Last  Lab Results  Component Value Date   MICRALBCREAT 1.0 10/12/2022    # Foot check nightly.  # Annual dilated diabetic eye exams.   - Diet: Make healthy diabetic food choices - Life style / activity / exercise: Discussed.  Advised for to increase physical activity /walking.  And if possible formal exercise.  2. Blood pressure  -  BP Readings from Last 1 Encounters:  05/19/23 120/70    - Control is in target.  - No change in current plans.  3. Lipid status / Hyperlipidemia - Last  Lab Results  Component Value Date   LDLCALC 71 05/16/2023   - Continue atorvastatin  10 mg daily.   Diagnoses and all orders for this visit:  Uncontrolled type 2 diabetes mellitus with hyperglycemia, with long-term current use of insulin  (HCC) -  Microalbumin / creatinine urine ratio -     Hemoglobin A1c -     BASIC METABOLIC PANEL WITH GFR  Hypercholesterolemia  Essential hypertension  Other orders -     dapagliflozin  propanediol (FARXIGA ) 10 MG TABS tablet; Take 1 tablet (10 mg total) by  mouth daily. -     Insulin  Glargine-Lixisenatide (SOLIQUA ) 100-33 UNT-MCG/ML SOPN; INJECT 28 UNITS UNDER THE SKIN BEFORE BREAKFAST and 28 before supper -     metFORMIN  (GLUCOPHAGE ) 500 MG tablet; Take 2 tablets (1,000 mg total) by mouth 2 (two) times daily with a meal. -     telmisartan  (MICARDIS ) 20 MG tablet; Take 1 tablet (20 mg total) by mouth daily. -     atorvastatin  (LIPITOR) 10 MG tablet; Take 1 tablet (10 mg total) by mouth daily.     DISPOSITION Follow up in clinic in 4 months suggested.  Labs prior to visit.   All questions answered and patient verbalized understanding of the plan.  Nicholas Fosberg, MD [facility] Endocrinology [facility] [address] [place], [address] [place], [place] [address] Phone # [phone]  At least part of this note was generated using voice recognition software. Inadvertent word errors may have occurred, which were not recognized during the proofreading process.

## 2023-07-22 ENCOUNTER — Other Ambulatory Visit: Payer: Self-pay

## 2023-07-22 DIAGNOSIS — E1165 Type 2 diabetes mellitus with hyperglycemia: Secondary | ICD-10-CM

## 2023-07-22 MED ORDER — ACCU-CHEK GUIDE TEST VI STRP: ORAL_STRIP | 12 refills | Status: AC

## 2023-08-23 ENCOUNTER — Other Ambulatory Visit: Payer: Self-pay

## 2023-09-13 ENCOUNTER — Other Ambulatory Visit: Payer: Medicare HMO

## 2023-09-14 LAB — HEMOGLOBIN A1C
Hgb A1c MFr Bld: 7.2 % — ABNORMAL HIGH (ref <5.7)
Mean Plasma Glucose: 160 mg/dL
eAG (mmol/L): 8.9 mmol/L

## 2023-09-14 LAB — MICROALBUMIN / CREATININE URINE RATIO
Creatinine, Urine: 91 mg/dL (ref 20–320)
Microalb, Ur: <0.2 mg/dL

## 2023-09-14 LAB — BASIC METABOLIC PANEL WITH GFR
BUN: 19 mg/dL (ref 7–25)
CO2: 31 mmol/L (ref 20–32)
Calcium: 9.6 mg/dL (ref 8.6–10.3)
Chloride: 102 mmol/L (ref 98–110)
Creat: 1.1 mg/dL (ref 0.70–1.35)
Glucose, Bld: 117 mg/dL — ABNORMAL HIGH (ref 65–99)
Potassium: 4.6 mmol/L (ref 3.5–5.3)
Sodium: 143 mmol/L (ref 135–146)
eGFR: 74 mL/min/{1.73_m2} (ref >=60)

## 2023-09-16 ENCOUNTER — Encounter: Payer: Self-pay | Admitting: Endocrinology

## 2023-09-16 ENCOUNTER — Ambulatory Visit (INDEPENDENT_AMBULATORY_CARE_PROVIDER_SITE_OTHER): Payer: Medicare HMO | Admitting: Endocrinology

## 2023-09-16 VITALS — BP 128/70 | HR 83 | Resp 20 | Ht 69.0 in | Wt 267.0 lb

## 2023-09-16 DIAGNOSIS — E1165 Type 2 diabetes mellitus with hyperglycemia: Secondary | ICD-10-CM | POA: Diagnosis not present

## 2023-09-16 DIAGNOSIS — Z794 Long term (current) use of insulin: Secondary | ICD-10-CM | POA: Diagnosis not present

## 2023-09-16 NOTE — Progress Notes (Signed)
 Outpatient Endocrinology Note [place] Fancy Dunkley, MD  09/16/23  Patient's Name: Nicholas Schroeder    DOB: 29-Aug-1955    MRN: [id number]                                                    REASON OF VISIT: Follow-up of type 2 diabetes mellitus  PCP: Claudell Cruz, MD  HISTORY OF PRESENT ILLNESS:   Nicholas Schroeder is a 68 y.o. old male with past medical history listed below, is here for follow-up type 2 diabetes mellitus.    Pertinent Diabetes History: He was diagnosed with type 2 diabetes mellitus around 2008.  He was initially treated with metformin  and insulin  therapy was started in 2011.  Chronic Diabetes Complications : Retinopathy: no. Last ophthalmology exam was done on annually, reportedly. Nephropathy: CKD III a, on telmisartan . Peripheral neuropathy: no Coronary artery disease: no Stroke: no  Relevant comorbidities and cardiovascular risk factors: Obesity: yes Body mass index is 39.43 kg/m.  Hypertension: yes Hyperlipidemia. Yes, on a statin.  Current / Home Diabetic regimen includes: Soliqua  28 in AM and 28 in PM Farxiga  10 mg daily. Metformin  1000 mg 2 times a day.  Prior diabetic medications:  Glycemic data:   Accu-Chek guide glucometer glucose data from April 18 to May 2 , 2025 reviewed.  Average blood sugar 137.  Checking blood sugar 2-3 times a day.  Fasting blood sugar mostly in the 80 - low 100 range.  Blood sugar in the afternoon 153, 165, 133.  Blood sugar at bedtime 150, 122, 171.  Occasionally 210, 228, 184.  Mostly acceptable blood sugar.  No hypoglycemia.  Dexcom was recommended in the past did not get prescription through from DME supplier.  Factors modifying glucose control: 1.  Diabetic diet assessment: 2-3 meals a day.  Avoiding excess sweets.  No regular soft drinks and sweet tea.  2.  Staying active or exercising: Walking occasionally.  3.  Medication compliance: compliant all of the time.  Interval history Glucometer data as reviewed above.   Mostly acceptable blood sugar.  Occasional hyperglycemia related to high carb meal.  Hemoglobin A1c 7.2%.  Recent laboratory results reviewed with normal electrolytes, renal function and normal urine microalbumin creatinine ratio.  He denies numbness and ting of the feet.  No vision problem.  No other complaints today.   REVIEW OF SYSTEMS As per history of present illness.   PAST MEDICAL HISTORY: Past Medical History:  Diagnosis Date   Cataract    forming both per pt    Diabetes mellitus, type II (HCC)    HLD (hyperlipidemia)    HTN (hypertension)    Obesity     PAST SURGICAL HISTORY: Past Surgical History:  Procedure Laterality Date   ABDOMINAL SURGERY      ALLERGIES: No Known Allergies  FAMILY HISTORY:  Family History  Problem Relation Age of Onset   Diabetes Mother    Diabetes Father    Diabetes Brother    Colon polyps Sister    Esophageal cancer Neg Hx    Rectal cancer Neg Hx    Stomach cancer Neg Hx    Colon cancer Neg Hx     SOCIAL HISTORY: Social History   Socioeconomic History   Marital status: Single    Spouse name: Not on file   Number of children: Not  on file   Years of education: Not on file   Highest education level: Not on file  Occupational History   Not on file  Tobacco Use   Smoking status: Never   Smokeless tobacco: Never  Substance and Sexual Activity   Alcohol use: No   Drug use: No   Sexual activity: Not on file  Other Topics Concern   Not on file  Social History Narrative   Not on file   Social Drivers of Health   Financial Resource Strain: Low Risk  (08/03/2023)   Received from [facility]   Overall Financial Resource Strain (CARDIA)    Difficulty of Paying Living Expenses: Not hard at all  Food Insecurity: No Food Insecurity (08/03/2023)   Received from [facility]   Hunger Vital Sign    Worried About Running Out of Food in the Last Year: Never true    Ran Out of Food in the Last Year: Never true  Transportation Needs:  No Transportation Needs (08/03/2023)   Received from [facility] - Transportation    Lack of Transportation (Medical): No    Lack of Transportation (Non-Medical): No  Physical Activity: Insufficiently Active (08/03/2023)   Received from [facility]   Exercise Vital Sign    Days of Exercise per Week: 2 days    Minutes of Exercise per Session: 30 min  Stress: No Stress Concern Present (08/03/2023)   Received from [facility] of Occupational Health - Occupational Stress Questionnaire    Feeling of Stress : Not at all  Social Connections: Moderately Integrated (08/03/2023)   Received from [facility]   Social Network    How would you rate your social network (family, work, friends)?: Adequate participation with social networks    MEDICATIONS:  Current Outpatient Medications  Medication Sig Dispense Refill   Accu-Chek Softclix Lancets lancets TEST BLOOD SUGAR THREE TIMES DAILY 300 each 3   amLODipine (NORVASC) 5 MG tablet Take by mouth.     aspirin 81 MG tablet Take 81 mg by mouth daily.     atorvastatin  (LIPITOR) 10 MG tablet Take 1 tablet (10 mg total) by mouth daily. 90 tablet 3   azaTHIOprine (IMURAN) 50 MG tablet Take 100 mg by mouth daily.     Blood Glucose Monitoring Suppl (ACCU-CHEK GUIDE) w/Device KIT Use Accu Chek Guide to check blood sugar three times daily. 1 kit 0   chlorthalidone (HYGROTON) 25 MG tablet Take 12.5 mg by mouth every morning.     dapagliflozin  propanediol (FARXIGA ) 10 MG TABS tablet Take 1 tablet (10 mg total) by mouth daily. 90 tablet 3   DROPLET PEN NEEDLES 31G X 5 MM MISC USE WITH INSULIN  PEN AS DIRECTED 300 each 3   glucose blood (ACCU-CHEK GUIDE TEST) test strip Use to test blood glucose before meals and at bedtime 300 each 12   hydrochlorothiazide  (MICROZIDE ) 12.5 MG capsule TAKE 1 CAPSULE (12.5 MG TOTAL) BY MOUTH DAILY. 90 capsule 0   Insulin  Glargine-Lixisenatide (SOLIQUA ) 100-33 UNT-MCG/ML SOPN INJECT 28 UNITS UNDER  THE SKIN BEFORE BREAKFAST and 28 before supper 54 mL 3   ketorolac (ACULAR) 0.5 % ophthalmic solution Place 1 drop into the left eye 4 (four) times daily.     metFORMIN  (GLUCOPHAGE ) 500 MG tablet Take 2 tablets (1,000 mg total) by mouth 2 (two) times daily with a meal. 360 tablet 3   moxifloxacin (VIGAMOX) 0.5 % ophthalmic solution Place 1 drop into the left eye 4 (  four) times daily.     OVER THE COUNTER MEDICATION      telmisartan  (MICARDIS ) 20 MG tablet Take 1 tablet (20 mg total) by mouth daily. 90 tablet 3   No current facility-administered medications for this visit.    PHYSICAL EXAM: Vitals:   09/16/23 0806  BP: 128/70  Pulse: 83  Resp: 20  SpO2: 95%  Weight: 267 lb (121.1 kg)  Height: 5\' 9"  (1.753 m)    Body mass index is 39.43 kg/m.  Wt Readings from Last 3 Encounters:  09/16/23 267 lb (121.1 kg)  05/19/23 268 lb 12.8 oz (121.9 kg)  02/15/23 266 lb (120.7 kg)    General: Well developed, well nourished male in no apparent distress.  HEENT: AT/, no external lesions.  Eyes: Conjunctiva clear and no icterus. Neck: Neck supple  Lungs: Respirations not labored Neurologic: Alert, oriented, normal speech Extremities / Skin: Dry.   Psychiatric: Does not appear depressed or anxious  Diabetic Foot Exam - Simple   Simple Foot Form Diabetic Foot exam was performed with the following findings: Yes 09/16/2023  8:11 AM  Visual Inspection No deformities, no ulcerations, no other skin breakdown bilaterally: Yes Sensation Testing Intact to touch and monofilament testing bilaterally: Yes Pulse Check Posterior Tibialis and Dorsalis pulse intact bilaterally: Yes Comments    LABS Reviewed Lab Results  Component Value Date   HGBA1C 7.2 (H) 09/13/2023   HGBA1C 7.3 (H) 05/16/2023   HGBA1C 7.8 (H) 02/03/2023   Lab Results  Component Value Date   FRUCTOSAMINE 249 06/08/2022   FRUCTOSAMINE 227 01/04/2017   FRUCTOSAMINE 238 11/02/2016   Lab Results  Component Value Date    CHOL 134 05/16/2023   HDL 45 05/16/2023   LDLCALC 71 05/16/2023   TRIG 93 05/16/2023   CHOLHDL 3.0 05/16/2023   Lab Results  Component Value Date   MICRALBCREAT NOTE 09/13/2023   MICRALBCREAT 1.0 10/12/2022   Lab Results  Component Value Date   CREATININE 1.10 09/13/2023   Lab Results  Component Value Date   GFR 53.13 (L) 02/03/2023    ASSESSMENT / PLAN  1. Uncontrolled type 2 diabetes mellitus with hyperglycemia, with long-term current use of insulin  (HCC)     Diabetes Mellitus type 2, complicated by CKD. - Diabetic status / severity: Uncontrolled.  Improving.  Lab Results  Component Value Date   HGBA1C 7.2 (H) 09/13/2023    - Hemoglobin A1c goal : <6.5%  Mostly acceptable blood sugar on glucometer with occasional hypoglycemia related to high carb meal.  Discussed about limiting carbohydrate and avoiding snacks in between the meals.  - Medications: see below.  No change.  I) continue Soliqua  28 units 2 times a day. II) continue metformin  1000 mg 2 times a day. III) continue Farxiga  10 mg daily.  - Home glucose testing: Before meals and at bedtime. - Discussed/ Gave Hypoglycemia treatment plan.  # Consult : not required at this time.   # Annual urine for microalbuminuria/ creatinine ratio, no microalbuminuria currently, continue ACE/ARB /telmisartan . Last  Lab Results  Component Value Date   MICRALBCREAT NOTE 09/13/2023    # Foot check nightly.  # Annual dilated diabetic eye exams.   - Diet: Make healthy diabetic food choices - Life style / activity / exercise: Discussed.  Advised for to increase physical activity /walking.  And if possible formal exercise.  2. Blood pressure  -  BP Readings from Last 1 Encounters:  09/16/23 128/70    - Control is in target.  -  No change in current plans.  3. Lipid status / Hyperlipidemia - Last  Lab Results  Component Value Date   LDLCALC 71 05/16/2023   - Continue atorvastatin  10 mg daily.   Diagnoses  and all orders for this visit:  Uncontrolled type 2 diabetes mellitus with hyperglycemia, with long-term current use of insulin  (HCC)     DISPOSITION Follow up in clinic in 4 months suggested.  Labs on the same day of the visit.  All questions answered and patient verbalized understanding of the plan.  [place] Bracen Schum, MD [facility] Endocrinology [facility] [address], [address] [place], [place] [address] Phone # [phone]  At least part of this note was generated using voice recognition software. Inadvertent word errors may have occurred, which were not recognized during the proofreading process.

## 2023-09-16 NOTE — Patient Instructions (Signed)
No change in diabetes medications.   Bring meter in follow up visit.

## 2023-10-24 ENCOUNTER — Other Ambulatory Visit: Payer: Self-pay

## 2023-10-24 DIAGNOSIS — E1165 Type 2 diabetes mellitus with hyperglycemia: Secondary | ICD-10-CM

## 2023-10-24 MED ORDER — DROPLET PEN NEEDLES 31G X 5 MM MISC: 3 refills | Status: AC

## 2023-10-27 ENCOUNTER — Other Ambulatory Visit: Payer: Self-pay

## 2023-11-07 ENCOUNTER — Other Ambulatory Visit: Payer: Self-pay

## 2023-11-07 MED ORDER — ACCU-CHEK SOFTCLIX LANCETS MISC: 3 refills | Status: AC

## 2024-01-17 ENCOUNTER — Ambulatory Visit: Admitting: Endocrinology

## 2024-01-17 ENCOUNTER — Ambulatory Visit: Payer: Self-pay | Admitting: Endocrinology

## 2024-01-17 ENCOUNTER — Encounter: Payer: Self-pay | Admitting: Endocrinology

## 2024-01-17 VITALS — BP 112/70 | HR 66 | Resp 20 | Ht 69.0 in | Wt 256.6 lb

## 2024-01-17 DIAGNOSIS — E7800 Pure hypercholesterolemia, unspecified: Secondary | ICD-10-CM | POA: Diagnosis not present

## 2024-01-17 DIAGNOSIS — E1165 Type 2 diabetes mellitus with hyperglycemia: Secondary | ICD-10-CM | POA: Diagnosis not present

## 2024-01-17 DIAGNOSIS — Z794 Long term (current) use of insulin: Secondary | ICD-10-CM

## 2024-01-17 LAB — POCT GLYCOSYLATED HEMOGLOBIN (HGB A1C): Hemoglobin A1C: 6.7 % — AB (ref 4.0–5.6)

## 2024-01-17 NOTE — Progress Notes (Signed)
 Outpatient Endocrinology Note [place] Malayna Noori, MD  01/17/24  Patient's Name: Nicholas Schroeder    DOB: [birth year]    MRN: [id number]                                                    REASON OF VISIT: Follow-up of type 2 diabetes mellitus  PCP: Rena Luke POUR, MD  HISTORY OF PRESENT ILLNESS:   Nicholas Schroeder is a 67 y.o. old male with past medical history listed below, is here for follow-up type 2 diabetes mellitus.    Pertinent Diabetes History: He was diagnosed with type 2 diabetes mellitus around 2008.  He was initially treated with metformin  and insulin  therapy was started in 2011.  Chronic Diabetes Complications : Retinopathy: no. Last ophthalmology exam was done on annually, reportedly. Nephropathy: CKD III a, on telmisartan . Peripheral neuropathy: no Coronary artery disease: no Stroke: no  Relevant comorbidities and cardiovascular risk factors: Obesity: yes Body mass index is 37.89 kg/m.  Hypertension: yes Hyperlipidemia. Yes, on a statin.  Current / Home Diabetic regimen includes: Soliqua  28 in AM and 28 in PM Farxiga  10 mg daily. Metformin  1000 mg 2 times a day.  Prior diabetic medications:  Glycemic data:   Accu-Chek guide glucometer glucose data from August 19 to January 17, 2024 reviewed.  Average blood sugar 134, taking blood sugar mostly twice a day.  Fasting blood sugar 119, 116, 82, 110, 153.  Blood sugar in afternoon 98.  164, blood sugar at bedtime 123, 163, 179, 193.  No hypoglycemia.  Dexcom was recommended in the past did not get prescription through from DME supplier.  Factors modifying glucose control: 1.  Diabetic diet assessment: 2-3 meals a day.  Avoiding excess sweets.  No regular soft drinks and sweet tea.  2.  Staying active or exercising: Walking occasionally.  3.  Medication compliance: compliant all of the time.  Interval history Glucometer data as reviewed above.  Mostly acceptable blood sugar.  Diabetes regimen as reviewed and noted  above.  Hemoglobin A1c today 6.7%.  Mildly improved.  Denies numbness and tingling of the feet.  No vision problem.  No other complaints today.   REVIEW OF SYSTEMS As per history of present illness.   PAST MEDICAL HISTORY: Past Medical History:  Diagnosis Date   Cataract    forming both per pt    Diabetes mellitus, type II (HCC)    HLD (hyperlipidemia)    HTN (hypertension)    Obesity     PAST SURGICAL HISTORY: Past Surgical History:  Procedure Laterality Date   ABDOMINAL SURGERY      ALLERGIES: No Known Allergies  FAMILY HISTORY:  Family History  Problem Relation Age of Onset   Diabetes Mother    Diabetes Father    Diabetes Brother    Colon polyps Sister    Esophageal cancer Neg Hx    Rectal cancer Neg Hx    Stomach cancer Neg Hx    Colon cancer Neg Hx     SOCIAL HISTORY: Social History   Socioeconomic History   Marital status: Single    Spouse name: Not on file   Number of children: Not on file   Years of education: Not on file   Highest education level: Not on file  Occupational History   Not on file  Tobacco Use  Smoking status: Never   Smokeless tobacco: Never  Substance and Sexual Activity   Alcohol use: No   Drug use: No   Sexual activity: Not on file  Other Topics Concern   Not on file  Social History Narrative   Not on file   Social Drivers of Health   Financial Resource Strain: Low Risk  (08/03/2023)   Received from [facility]   Overall Financial Resource Strain (CARDIA)    Difficulty of Paying Living Expenses: Not hard at all  Food Insecurity: No Food Insecurity (08/03/2023)   Received from [facility]   Hunger Vital Sign    Within the past 12 months, you worried that your food would run out before you got the money to buy more.: Never true    Within the past 12 months, the food you bought just didn't last and you didn't have money to get more.: Never true  Transportation Needs: No Transportation Needs (08/03/2023)   Received  from [facility] - Transportation    Lack of Transportation (Medical): No    Lack of Transportation (Non-Medical): No  Physical Activity: Insufficiently Active (08/03/2023)   Received from [facility]   Exercise Vital Sign    On average, how many days per week do you engage in moderate to strenuous exercise (like a brisk walk)?: 2 days    On average, how many minutes do you engage in exercise at this level?: 30 min  Stress: No Stress Concern Present (08/03/2023)   Received from [facility] - Occupational Stress Questionnaire    Feeling of Stress : Not at all  Social Connections: Moderately Integrated (08/03/2023)   Received from [facility]   Social Network    How would you rate your social network (family, work, friends)?: Adequate participation with social networks    MEDICATIONS:  Current Outpatient Medications  Medication Sig Dispense Refill   Accu-Chek Softclix Lancets lancets TEST BLOOD SUGAR THREE TIMES DAILY 300 each 3   amLODipine (NORVASC) 5 MG tablet Take by mouth.     aspirin 81 MG tablet Take 81 mg by mouth daily.     atorvastatin  (LIPITOR) 10 MG tablet Take 1 tablet (10 mg total) by mouth daily. 90 tablet 3   azaTHIOprine (IMURAN) 50 MG tablet Take 100 mg by mouth daily.     Blood Glucose Monitoring Suppl (ACCU-CHEK GUIDE) w/Device KIT Use Accu Chek Guide to check blood sugar three times daily. 1 kit 0   chlorthalidone (HYGROTON) 25 MG tablet Take 12.5 mg by mouth every morning.     dapagliflozin  propanediol (FARXIGA ) 10 MG TABS tablet Take 1 tablet (10 mg total) by mouth daily. 90 tablet 3   glucose blood (ACCU-CHEK GUIDE TEST) test strip Use to test blood glucose before meals and at bedtime 300 each 12   hydrochlorothiazide  (MICROZIDE ) 12.5 MG capsule TAKE 1 CAPSULE (12.5 MG TOTAL) BY MOUTH DAILY. 90 capsule 0   Insulin  Glargine-Lixisenatide (SOLIQUA ) 100-33 UNT-MCG/ML SOPN INJECT 28 UNITS UNDER THE SKIN BEFORE  BREAKFAST and 28 before supper 54 mL 3   Insulin  Pen Needle (DROPLET PEN NEEDLES) 31G X 5 MM MISC USE WITH INSULIN  PEN AS DIRECTED to administer insulin  300 each 3   ketorolac (ACULAR) 0.5 % ophthalmic solution Place 1 drop into the left eye 4 (four) times daily.     metFORMIN  (GLUCOPHAGE ) 500 MG tablet Take 2 tablets (1,000 mg total) by mouth 2 (two) times daily with a  meal. 360 tablet 3   OVER THE COUNTER MEDICATION      telmisartan  (MICARDIS ) 20 MG tablet Take 1 tablet (20 mg total) by mouth daily. 90 tablet 3   moxifloxacin (VIGAMOX) 0.5 % ophthalmic solution Place 1 drop into the left eye 4 (four) times daily. (Patient not taking: Reported on 01/17/2024)     No current facility-administered medications for this visit.    PHYSICAL EXAM: Vitals:   01/17/24 0811  BP: 112/70  Pulse: 66  Resp: 20  SpO2: 97%  Weight: 256 lb 9.6 oz (116.4 kg)  Height: 5' 9 (1.753 m)    Body mass index is 37.89 kg/m.  Wt Readings from Last 3 Encounters:  01/17/24 256 lb 9.6 oz (116.4 kg)  09/16/23 267 lb (121.1 kg)  05/19/23 268 lb 12.8 oz (121.9 kg)    General: Well developed, well nourished male in no apparent distress.  HEENT: AT/[place], no external lesions.  Eyes: Conjunctiva clear and no icterus. Neck: Neck supple  Lungs: Respirations not labored Neurologic: Alert, oriented, normal speech Extremities / Skin: Dry.   Psychiatric: Does not appear depressed or anxious  Diabetic Foot Exam - Simple   No data filed    LABS Reviewed Lab Results  Component Value Date   HGBA1C 6.7 (A) 01/17/2024   HGBA1C 7.2 (H) 09/13/2023   HGBA1C 7.3 (H) 05/16/2023   Lab Results  Component Value Date   FRUCTOSAMINE 249 06/08/2022   FRUCTOSAMINE 227 01/04/2017   FRUCTOSAMINE 238 11/02/2016   Lab Results  Component Value Date   CHOL 134 05/16/2023   HDL 45 05/16/2023   LDLCALC 71 05/16/2023   TRIG 93 05/16/2023   CHOLHDL 3.0 05/16/2023   Lab Results  Component Value Date   MICRALBCREAT NOTE  09/13/2023   Lab Results  Component Value Date   CREATININE 1.10 09/13/2023   Lab Results  Component Value Date   GFR 53.13 (L) 02/03/2023    ASSESSMENT / PLAN  1. Uncontrolled type 2 diabetes mellitus with hyperglycemia, with long-term current use of insulin  (HCC)   2. Hypercholesterolemia     Diabetes Mellitus type 2, complicated by CKD. - Diabetic status / severity: Fair control.  Lab Results  Component Value Date   HGBA1C 6.7 (A) 01/17/2024    - Hemoglobin A1c goal : <6.5%  Mostly acceptable blood sugar on glucometer.  Discussed about limiting carbohydrate and avoiding snacks in between the meals.  - Medications: see below.  No change.  I) continue Soliqua  28 units 2 times a day. II) continue metformin  1000 mg 2 times a day. III) continue Farxiga  10 mg daily.  - Home glucose testing: Before meals and at bedtime. - Discussed/ Gave Hypoglycemia treatment plan.  # Consult : not required at this time.   # Annual urine for microalbuminuria/ creatinine ratio, no microalbuminuria currently, continue ACE/ARB /telmisartan  /SGLT2 inhibitor. Last  Lab Results  Component Value Date   MICRALBCREAT NOTE 09/13/2023    # Foot check nightly.  # Annual dilated diabetic eye exams.  Reports has diabetic eye exam appointment in few weeks.  - Diet: Make healthy diabetic food choices - Life style / activity / exercise: Discussed.  Advised for to increase physical activity /walking.  And if possible formal exercise.  2. Blood pressure  -  BP Readings from Last 1 Encounters:  01/17/24 112/70    - Control is in target.  - No change in current plans.  3. Lipid status / Hyperlipidemia - Last  Lab Results  Component Value Date   LDLCALC 71 05/16/2023   - Continue atorvastatin  10 mg daily.   Diagnoses and all orders for this visit:  Uncontrolled type 2 diabetes mellitus with hyperglycemia, with long-term current use of insulin  (HCC) -     POCT glycosylated hemoglobin  (Hb A1C) -     Basic metabolic panel with GFR -     Hemoglobin A1c -     Lipid panel  Hypercholesterolemia -     Lipid panel     DISPOSITION Follow up in clinic in 4 months suggested.  Labs prior to follow-up visit as ordered.   All questions answered and patient verbalized understanding of the plan.  [place] Mycheal Veldhuizen, MD [facility] Endocrinology [facility] [address] [place], [address] [place], [place] [address] Phone # [phone]  At least part of this note was generated using voice recognition software. Inadvertent word errors may have occurred, which were not recognized during the proofreading process.

## 2024-04-02 ENCOUNTER — Other Ambulatory Visit: Payer: Self-pay | Admitting: Endocrinology

## 2024-04-24 ENCOUNTER — Other Ambulatory Visit: Payer: Self-pay | Admitting: Endocrinology

## 2024-05-08 ENCOUNTER — Other Ambulatory Visit: Payer: Self-pay | Admitting: Endocrinology

## 2024-05-18 ENCOUNTER — Other Ambulatory Visit

## 2024-05-22 ENCOUNTER — Ambulatory Visit: Admitting: Endocrinology

## 2024-06-05 ENCOUNTER — Encounter: Payer: Self-pay | Admitting: Endocrinology

## 2024-06-05 ENCOUNTER — Telehealth: Payer: Self-pay | Admitting: Endocrinology

## 2024-06-05 ENCOUNTER — Other Ambulatory Visit

## 2024-06-05 ENCOUNTER — Ambulatory Visit: Admitting: Endocrinology

## 2024-06-05 ENCOUNTER — Ambulatory Visit: Payer: Self-pay | Admitting: Endocrinology

## 2024-06-05 VITALS — BP 130/70 | HR 85 | Resp 16 | Ht 69.0 in | Wt 274.8 lb

## 2024-06-05 DIAGNOSIS — E7800 Pure hypercholesterolemia, unspecified: Secondary | ICD-10-CM | POA: Diagnosis not present

## 2024-06-05 DIAGNOSIS — E1165 Type 2 diabetes mellitus with hyperglycemia: Secondary | ICD-10-CM | POA: Diagnosis not present

## 2024-06-05 DIAGNOSIS — Z794 Long term (current) use of insulin: Secondary | ICD-10-CM | POA: Diagnosis not present

## 2024-06-05 LAB — POCT GLYCOSYLATED HEMOGLOBIN (HGB A1C): Hemoglobin A1C: 7.3 % — AB (ref 4.0–5.6)

## 2024-06-05 NOTE — Telephone Encounter (Signed)
 Error

## 2024-06-05 NOTE — Progress Notes (Signed)
 "  Outpatient Endocrinology Note Karan Ramnauth, MD  06/05/24  Patient's Name: Nicholas Schroeder    DOB: 09-13-1955    MRN: [id number]                                                    REASON OF VISIT: Follow-up of type 2 diabetes mellitus  PCP: Rena Luke POUR, MD  HISTORY OF PRESENT ILLNESS:   Nicholas Schroeder is a 69 y.o. old male with past medical history listed below, is here for follow-up type 2 diabetes mellitus.    Interval history Glucometer data as reviewed below.  Mostly acceptable blood sugar in the range of 100-130s.  Occasional hyperglycemia with blood sugars in the low 200s mostly in the late afternoon and evening.  No hypoglycemia.  Hemoglobin A1c 7.3% today mildly worsening.  Denied numbness and ting of the feet.  No vision problem.  No other complaints today.  Current / Home Diabetic regimen includes: Soliqua  28 in AM and 28 in PM Farxiga  10 mg daily. Metformin  1000 mg 2 times a day.  Glycemic data:   Accu-Chek guide glucometer glucose data from January 6 to June 05, 2024 reviewed.  Average blood sugar 158, taking blood sugar mostly twice a day. Mostly acceptable blood sugar in the range of 100-100 30s.  Occasional hyperglycemia with blood sugars in the low 200s mostly in the late afternoon and evening.  No hypoglycemia.  Dexcom was recommended in the past did not get prescription through from DME supplier.  Factors modifying glucose control: 1.  Diabetic diet assessment: 2-3 meals a day.  Avoiding excess sweets.  No regular soft drinks and sweet tea.  2.  Staying active or exercising: Walking occasionally.  3.  Medication compliance: compliant all of the time.  Pertinent Diabetes History: Reviewed and updated He was diagnosed with type 2 diabetes mellitus around 2008.  He was initially treated with metformin  and insulin  therapy was started in 2011.  Chronic Diabetes Complications : Retinopathy: no. Last ophthalmology exam was done on annually, reportedly, Dr.  Octavia.  Nephropathy: CKD III a, on telmisartan . Peripheral neuropathy: no Coronary artery disease: no Stroke: no  Prior diabetic medications:  Relevant comorbidities and cardiovascular risk factors: Obesity: yes Body mass index is 40.58 kg/m.  Hypertension: yes Hyperlipidemia. Yes, on a statin.  REVIEW OF SYSTEMS As per history of present illness.   PAST MEDICAL HISTORY: Past Medical History:  Diagnosis Date   Cataract    forming both per pt    Diabetes mellitus, type II (HCC)    HLD (hyperlipidemia)    HTN (hypertension)    Obesity     PAST SURGICAL HISTORY: Past Surgical History:  Procedure Laterality Date   ABDOMINAL SURGERY      ALLERGIES: No Known Allergies  FAMILY HISTORY:  Family History  Problem Relation Age of Onset   Diabetes Mother    Diabetes Father    Diabetes Brother    Colon polyps Sister    Esophageal cancer Neg Hx    Rectal cancer Neg Hx    Stomach cancer Neg Hx    Colon cancer Neg Hx     SOCIAL HISTORY: Social History   Socioeconomic History   Marital status: Single    Spouse name: Not on file   Number of children: Not on file   Years  of education: Not on file   Highest education level: Not on file  Occupational History   Not on file  Tobacco Use   Smoking status: Never   Smokeless tobacco: Never  Substance and Sexual Activity   Alcohol use: No   Drug use: No   Sexual activity: Not on file  Other Topics Concern   Not on file  Social History Narrative   Not on file   Social Drivers of Health   Tobacco Use: Low Risk (06/05/2024)   Patient History    Smoking Tobacco Use: Never    Smokeless Tobacco Use: Never    Passive Exposure: Not on file  Financial Resource Strain: Low Risk (08/03/2023)   Received from [facility]   Overall Financial Resource Strain (CARDIA)    Difficulty of Paying Living Expenses: Not hard at all  Food Insecurity: No Food Insecurity (08/03/2023)   Received from [facility]   Epic    Within the  past 12 months, you worried that your food would run out before you got the money to buy more.: Never true    Within the past 12 months, the food you bought just didn't last and you didn't have money to get more.: Never true  Transportation Needs: No Transportation Needs (08/03/2023)   Received from [facility] - Transportation    Lack of Transportation (Medical): No    Lack of Transportation (Non-Medical): No  Physical Activity: Insufficiently Active (08/03/2023)   Received from [facility]   Exercise Vital Sign    On average, how many days per week do you engage in moderate to strenuous exercise (like a brisk walk)?: 2 days    On average, how many minutes do you engage in exercise at this level?: 30 min  Stress: No Stress Concern Present (08/03/2023)   Received from [facility] - Occupational Stress Questionnaire    Feeling of Stress : Not at all  Social Connections: Moderately Integrated (08/03/2023)   Received from [facility]   Social Network    How would you rate your social network (family, work, friends)?: Adequate participation with social networks  Depression (PHQ2-9): Not on file  Alcohol Screen: Not on file  Housing: Low Risk (08/03/2023)   Received from [facility]    In the last 12 months, was there a time when you were not able to pay the mortgage or rent on time?: No    In the past 12 months, how many times have you moved where you were living?: 0    At any time in the past 12 months, were you homeless or living in a shelter (including now)?: No  Utilities: Not At Risk (08/03/2023)   Received from [facility] Utilities    Threatened with loss of utilities: No  Health Literacy: Not on file    MEDICATIONS:  Current Outpatient Medications  Medication Sig Dispense Refill   Accu-Chek Softclix Lancets lancets TEST BLOOD SUGAR THREE TIMES DAILY 300 each 3   amLODipine (NORVASC) 5 MG tablet Take by  mouth.     aspirin 81 MG tablet Take 81 mg by mouth daily.     atorvastatin  (LIPITOR) 10 MG tablet TAKE 1 TABLET EVERY DAY 90 tablet 3   azaTHIOprine (IMURAN) 50 MG tablet Take 100 mg by mouth daily.     Blood Glucose Monitoring Suppl (ACCU-CHEK GUIDE) w/Device KIT Use Accu Chek Guide to check blood  sugar three times daily. 1 kit 0   chlorthalidone (HYGROTON) 25 MG tablet Take 12.5 mg by mouth every morning.     FARXIGA  10 MG TABS tablet TAKE 1 TABLET EVERY DAY 90 tablet 3   glucose blood (ACCU-CHEK GUIDE TEST) test strip Use to test blood glucose before meals and at bedtime 300 each 12   hydrochlorothiazide  (MICROZIDE ) 12.5 MG capsule TAKE 1 CAPSULE (12.5 MG TOTAL) BY MOUTH DAILY. 90 capsule 0   Insulin  Glargine-Lixisenatide (SOLIQUA ) 100-33 UNT-MCG/ML SOPN INJECT 28 UNITS UNDER THE SKIN BEFORE BREAKFAST AND 28 BEFORE SUPPER 45 mL 4   Insulin  Pen Needle (DROPLET PEN NEEDLES) 31G X 5 MM MISC USE WITH INSULIN  PEN AS DIRECTED to administer insulin  300 each 3   ketorolac (ACULAR) 0.5 % ophthalmic solution Place 1 drop into the left eye 4 (four) times daily.     metFORMIN  (GLUCOPHAGE ) 500 MG tablet TAKE 2 TABLETS TWICE DAILY WITH MEALS 360 tablet 3   moxifloxacin (VIGAMOX) 0.5 % ophthalmic solution Place 1 drop into the left eye 4 (four) times daily.     OVER THE COUNTER MEDICATION      telmisartan  (MICARDIS ) 20 MG tablet Take 1 tablet (20 mg total) by mouth daily. 90 tablet 3   No current facility-administered medications for this visit.    PHYSICAL EXAM: Vitals:   06/05/24 0820  BP: 130/70  Pulse: 85  Resp: 16  SpO2: 95%  Weight: 274 lb 12.8 oz (124.6 kg)  Height: 5' 9 (1.753 m)    Body mass index is 40.58 kg/m.  Wt Readings from Last 3 Encounters:  06/05/24 274 lb 12.8 oz (124.6 kg)  01/17/24 256 lb 9.6 oz (116.4 kg)  09/16/23 267 lb (121.1 kg)    General: Well developed, well nourished male in no apparent distress.  HEENT: AT/[place], no external lesions.  Eyes: Conjunctiva clear  and no icterus. Neck: Neck supple  Lungs: Respirations not labored Neurologic: Alert, oriented, normal speech Extremities / Skin: Dry.   Psychiatric: Does not appear depressed or anxious  Diabetic Foot Exam - Simple   No data filed    LABS Reviewed Lab Results  Component Value Date   HGBA1C 7.3 (A) 06/05/2024   HGBA1C 6.7 (A) 01/17/2024   HGBA1C 7.2 (H) 09/13/2023   Lab Results  Component Value Date   FRUCTOSAMINE 249 06/08/2022   FRUCTOSAMINE 227 01/04/2017   FRUCTOSAMINE 238 11/02/2016   Lab Results  Component Value Date   CHOL 134 05/16/2023   HDL 45 05/16/2023   LDLCALC 71 05/16/2023   TRIG 93 05/16/2023   CHOLHDL 3.0 05/16/2023   Lab Results  Component Value Date   MICRALBCREAT NOTE 09/13/2023   Lab Results  Component Value Date   CREATININE 1.10 09/13/2023   Lab Results  Component Value Date   GFR 53.13 (L) 02/03/2023    ASSESSMENT / PLAN  1. Uncontrolled type 2 diabetes mellitus with hyperglycemia, with long-term current use of insulin  (HCC)   2. Hypercholesterolemia     Diabetes Mellitus type 2, complicated by CKD. - Diabetic status / severity: Uncontrolled  Lab Results  Component Value Date   HGBA1C 7.3 (A) 06/05/2024    - Hemoglobin A1c goal : <6.5%  Occasional hyperglycemia will related to snack, asked patient to avoid snacks in between the meals and asked to limit carbohydrate with meals.  - MEDICATIONS: see below.  No change.  I) continue Soliqua  28 units 2 times a day. II) continue metformin  1000 mg 2 times a  day. III) continue Farxiga  10 mg daily.  - Home glucose testing: Before meals and at bedtime.  - Discussed/ Gave Hypoglycemia treatment plan.  # Consult : not required at this time.   # Annual urine for microalbuminuria/ creatinine ratio, no microalbuminuria currently, continue ACE/ARB / telmisartan  /SGLT2 inhibitor. Last  Lab Results  Component Value Date   MICRALBCREAT NOTE 09/13/2023    # Foot check nightly.  #  Annual dilated diabetic eye exams.  Reports has diabetic eye exam appointment in few weeks.  - Diet: Make healthy diabetic food choices - Life style / activity / exercise: Discussed.  Advised to increase physical activity /walking.  And if possible formal exercise.  2. Blood pressure  -  BP Readings from Last 1 Encounters:  06/05/24 130/70    - Control is in target.  - No change in current plans.  3. Lipid status / Hyperlipidemia - Last  Lab Results  Component Value Date   LDLCALC 71 05/16/2023   - Continue atorvastatin  10 mg daily.   Diagnoses and all orders for this visit:  Uncontrolled type 2 diabetes mellitus with hyperglycemia, with long-term current use of insulin  (HCC) -     POCT glycosylated hemoglobin (Hb A1C) -     Basic metabolic panel with GFR -     Lipid panel  Hypercholesterolemia -     Lipid panel    DISPOSITION Follow up in clinic in 4 months suggested.  Labs today as ordered.  All questions answered and patient verbalized understanding of the plan.  Arvine Clayburn, MD [facility] Endocrinology [facility] [address] [place], [address] [place], [place] [address] Phone # [phone]  At least part of this note was generated using voice recognition software. Inadvertent word errors may have occurred, which were not recognized during the proofreading process. "

## 2024-06-06 LAB — BASIC METABOLIC PANEL WITH GFR
BUN: 18 mg/dL (ref 7–25)
CO2: 32 mmol/L (ref 20–32)
Calcium: 9.6 mg/dL (ref 8.6–10.3)
Chloride: 104 mmol/L (ref 98–110)
Creat: 1.2 mg/dL (ref 0.70–1.35)
Glucose, Bld: 135 mg/dL — ABNORMAL HIGH (ref 65–99)
Potassium: 5.1 mmol/L (ref 3.5–5.3)
Sodium: 142 mmol/L (ref 135–146)
eGFR: 66 mL/min/1.73m2

## 2024-06-06 LAB — LIPID PANEL
Cholesterol: 122 mg/dL
HDL: 49 mg/dL
LDL Cholesterol (Calc): 57 mg/dL
Non-HDL Cholesterol (Calc): 73 mg/dL
Total CHOL/HDL Ratio: 2.5 (calc)
Triglycerides: 77 mg/dL

## 2024-10-03 ENCOUNTER — Ambulatory Visit: Admitting: Endocrinology
